# Patient Record
Sex: Female | Born: 1970 | Race: White | Hispanic: No | Marital: Married | Smoking: Never smoker
Health system: Southern US, Community
[De-identification: ages and names within clinical notes are randomized; demographics above are authoritative.]

## PROBLEM LIST (undated history)

## (undated) DIAGNOSIS — I10 Essential (primary) hypertension: Secondary | ICD-10-CM

## (undated) DIAGNOSIS — Z8489 Family history of other specified conditions: Secondary | ICD-10-CM

## (undated) DIAGNOSIS — Z9889 Other specified postprocedural states: Secondary | ICD-10-CM

## (undated) DIAGNOSIS — T4145XA Adverse effect of unspecified anesthetic, initial encounter: Secondary | ICD-10-CM

## (undated) DIAGNOSIS — D649 Anemia, unspecified: Secondary | ICD-10-CM

## (undated) DIAGNOSIS — T8859XA Other complications of anesthesia, initial encounter: Secondary | ICD-10-CM

## (undated) DIAGNOSIS — K219 Gastro-esophageal reflux disease without esophagitis: Secondary | ICD-10-CM

## (undated) DIAGNOSIS — F419 Anxiety disorder, unspecified: Secondary | ICD-10-CM

## (undated) DIAGNOSIS — R112 Nausea with vomiting, unspecified: Secondary | ICD-10-CM

---

## 2000-09-09 ENCOUNTER — Other Ambulatory Visit: Admission: RE | Admit: 2000-09-09 | Discharge: 2000-09-09 | Payer: Self-pay | Admitting: Family Medicine

## 2002-07-30 HISTORY — PX: TUBAL LIGATION: SHX77

## 2004-06-19 ENCOUNTER — Ambulatory Visit: Payer: Self-pay | Admitting: Family Medicine

## 2004-08-09 ENCOUNTER — Ambulatory Visit: Payer: Self-pay | Admitting: Family Medicine

## 2004-09-21 ENCOUNTER — Ambulatory Visit: Payer: Self-pay | Admitting: Family Medicine

## 2004-10-02 ENCOUNTER — Ambulatory Visit: Payer: Self-pay | Admitting: Family Medicine

## 2005-08-08 ENCOUNTER — Ambulatory Visit: Payer: Self-pay | Admitting: Family Medicine

## 2005-11-12 ENCOUNTER — Ambulatory Visit: Payer: Self-pay | Admitting: Family Medicine

## 2005-12-10 ENCOUNTER — Ambulatory Visit: Payer: Self-pay | Admitting: Family Medicine

## 2006-05-20 ENCOUNTER — Ambulatory Visit: Payer: Self-pay | Admitting: Family Medicine

## 2006-07-24 ENCOUNTER — Ambulatory Visit: Payer: Self-pay | Admitting: Family Medicine

## 2006-11-13 ENCOUNTER — Ambulatory Visit: Payer: Self-pay | Admitting: Family Medicine

## 2007-04-09 ENCOUNTER — Ambulatory Visit: Payer: Self-pay | Admitting: Family Medicine

## 2007-04-09 LAB — CONVERTED CEMR LAB
Ketones, urine, test strip: NEGATIVE
Nitrite: NEGATIVE
Specific Gravity, Urine: 1.015
Urobilinogen, UA: NEGATIVE
pH: 7

## 2007-04-14 ENCOUNTER — Telehealth: Payer: Self-pay | Admitting: Family Medicine

## 2007-04-14 DIAGNOSIS — E282 Polycystic ovarian syndrome: Secondary | ICD-10-CM | POA: Insufficient documentation

## 2007-04-15 ENCOUNTER — Ambulatory Visit: Payer: Self-pay | Admitting: Family Medicine

## 2007-04-15 DIAGNOSIS — I10 Essential (primary) hypertension: Secondary | ICD-10-CM

## 2007-05-08 ENCOUNTER — Telehealth: Payer: Self-pay | Admitting: Family Medicine

## 2007-05-28 ENCOUNTER — Ambulatory Visit: Payer: Self-pay | Admitting: Family Medicine

## 2007-06-09 ENCOUNTER — Ambulatory Visit: Payer: Self-pay | Admitting: Family Medicine

## 2007-08-20 ENCOUNTER — Ambulatory Visit: Payer: Self-pay | Admitting: Family Medicine

## 2007-12-30 ENCOUNTER — Ambulatory Visit: Payer: Self-pay | Admitting: Family Medicine

## 2008-01-01 ENCOUNTER — Telehealth: Payer: Self-pay | Admitting: Family Medicine

## 2008-09-02 ENCOUNTER — Ambulatory Visit: Payer: Self-pay | Admitting: Family Medicine

## 2008-09-02 DIAGNOSIS — B9789 Other viral agents as the cause of diseases classified elsewhere: Secondary | ICD-10-CM

## 2008-09-02 DIAGNOSIS — B349 Viral infection, unspecified: Secondary | ICD-10-CM | POA: Insufficient documentation

## 2008-09-23 ENCOUNTER — Telehealth: Payer: Self-pay | Admitting: Family Medicine

## 2008-10-19 ENCOUNTER — Ambulatory Visit: Payer: Self-pay | Admitting: Family Medicine

## 2008-10-20 ENCOUNTER — Telehealth (INDEPENDENT_AMBULATORY_CARE_PROVIDER_SITE_OTHER): Payer: Self-pay | Admitting: Internal Medicine

## 2010-03-07 ENCOUNTER — Encounter (INDEPENDENT_AMBULATORY_CARE_PROVIDER_SITE_OTHER): Payer: Self-pay | Admitting: *Deleted

## 2010-08-29 NOTE — Assessment & Plan Note (Signed)
Summary: elevated BP/KMO   Vital Signs:  Patient Profile:   40 Years Old Female Weight:      261 pounds Temp:     99.2 degrees F oral Pulse rate:   80 / minute Pulse rhythm:   regular BP sitting:   134 / 84  (left arm) Cuff size:   regular  Vitals Entered ByMarland Kitchen [place] (April 15, 2007 11:58 AM)                 Chief Complaint:  check bp.  History of Present Illness: Checking Bp since seen last time last week...not having any symptoms but BP has been elevated and she is worried about it. 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  Current Allergies: No known allergies    Family History:    Father A   Chol HTN    Mother A  HTN    No sibs    GF colon ca    GM leukemia     Physical Exam  General:     Well-developed,well-nourished,in no acute distress; alert,appropriate and cooperative throughout examination Head:     Normocephalic and atraumatic without obvious abnormalities. No apparent alopecia or balding. Eyes:     Conjunctiva clear bilaterally.  Ears:     External ear exam shows no significant lesions or deformities.  Otoscopic examination reveals clear canals, tympanic membranes are intact bilaterally without bulging, retraction, inflammation or discharge. Hearing is grossly normal bilaterally. Nose:     External nasal examination shows no deformity or inflammation. Nasal mucosa are pink and moist without lesions or exudates. Mouth:     Oral mucosa and oropharynx without lesions or exudates.  Teeth in good repair. Neck:     No deformities, masses, or tenderness noted. Chest Wall:     No deformities, masses, or tenderness noted. Lungs:     Normal respiratory effort, chest expands symmetrically. Lungs are clear to auscultation, no crackles or wheezes. Heart:     Normal rate and regular rhythm. S1 and S2 normal without gallop, murmur, click, rub or other extra sounds.    Impression & Recommendations:  Problem  # 1:  HYPERTENSION, BENIGN ESSENTIAL (ICD-401.1) Assessment: New  Her updated medication list for this problem includes:    Metoprolol Succinate 25 Mg Tb24 (Metoprolol succinate) .Marland Kitchen... 1/2 tab by mouth at night  BP today: 134/84 Prior BP: 149/101 (04/09/2007)   Complete Medication List: 1)  Metformin Hcl 850 Mg Tabs (Metformin hcl) .... Take 1 tablet by mouth two times a day 2)  Astelin 137 Mcg/spray Soln (Azelastine hcl) .... As needed 3)  Nasonex 50 Mcg/act Susp (Mometasone furoate) .... As needed 4)  Metoprolol Succinate 25 Mg Tb24 (Metoprolol succinate) .... 1/2 tab by mouth at night   Patient Instructions: 1)  RTC 6 wks for BP check.    Prescriptions: METOPROLOL SUCCINATE 25 MG  TB24 (METOPROLOL SUCCINATE) 1/2 tab by mouth at night  #15 x 12   Entered and Authorized by:   Shaune Leeks MD   Signed by:   Shaune Leeks MD on 04/15/2007   Method used:   Print then Give to Patient   RxID:   [id number]  ]

## 2010-08-29 NOTE — Letter (Signed)
Summary: Nadara Eaton letter  [place] at [facility]  [address] [place], [place] [address]   Phone: [phone]  Fax: [phone]       03/07/2010 MRN: [id number]  [facility] [address] [place], [place]  [address]  Dear Ms. [facility] [place], and [place] announce the retirement of Arta Silence, M.D., from full-time practice at the [facility] office effective January 26, 2010 and his plans of returning part-time.  It is important to Dr. Hetty Ely and to our practice that you understand that [facility] has seven physicians in our office for your health care needs.  We will continue to offer the same exceptional care that you have today.    Dr. Hetty Ely has spoken to many of you about his plans for retirement and returning part-time in the fall.   We will continue to work with you through the transition to schedule appointments for you in the office and meet the high standards that [place] is committed to.   Again, it is with great pleasure that we share the news that Dr. Hetty Ely will return to [facility] at [facility] in October of 2011 with a reduced schedule.    If you have any questions, or would like to request an appointment with one of our physicians, please call us at [phone] and press the option for Scheduling an appointment.  We take pleasure in providing you with excellent patient care and look forward to seeing you at your next office visit.  Our [facility] Physicians are:  Tillman Abide, M.D. Laurita Quint, M.D. Roxy Manns, M.D. Kerby Nora, M.D. Hannah Beat, M.D. Ruthe Mannan, M.D. We proudly welcomed Raechel Ache, M.D. and Eustaquio Boyden, M.D. to the practice in July/August 2011.  Sincerely,  [facility]

## 2010-12-15 NOTE — Assessment & Plan Note (Signed)
[facility]                                 ON-CALL NOTE   Jessica Hampton, Jessica Hampton                        MRN:          [id number]  DATE:07/27/2007                            DOB:          16-Sep-1970    PRIMARY CARE PHYSICIAN:  Dr. Hetty Ely.   Ms. Stankiewicz is a patient of Dr. Hetty Ely, who reports that she was started  on metoprolol XR 25 mg, a quarter of a tablet daily after noted to have  high blood pressure associated with headache.  She reports that the  headaches had improved while she was on the metoprolol, but over the  last week she has noticed that the headache has come back, but not as  intense.  She is wondering if her headaches are truly due to  hypertension since her blood pressure is now controlled, and she is  having the headaches again.   PLAN:  I advised the patient that since her headaches have started  again, although not as intense, she should follow up with Dr. Hetty Ely  to discuss further evaluation.  Patient agreed.  She will continue  taking the metoprolol as described by Dr. Hetty Ely, and will follow up  on Monday by phone for further recommendations.  Patient is aware she  can call with any concerns over the weekend.     Leanne Chang, M.D.  Electronically Signed    LA/MedQ  DD: 07/27/2007  DT: 07/27/2007  Job #: [id number]

## 2010-12-15 NOTE — Assessment & Plan Note (Signed)
[facility]                                 ON-CALL NOTE   KORTNE, ALL                        MRN:          [id number]  DATE:11/16/2006                            DOB:          08-Aug-1970    PRIMARY CARE PHYSICIAN:  Arta Silence, MD.   PHONE NUMBER:  Phone number is [phone].   SUBJECTIVE:  Currently being treated for sinus infection, has been on  amoxicillin 500 mg 2 tablets p.o. t.i.d. for 3 days.  She is also using  guaifenesin.  She feels her symptoms are not improving.   ASSESSMENT AND PLAN:  Recommended more time on the antibiotics.  Also  recommended nasal saline 3-4 times daily, as well as an allergy  medication.     Kerby Nora, MD  Electronically Signed    AB/MedQ  DD: 11/16/2006  DT: 11/16/2006  Job #: [id number]

## 2011-05-25 ENCOUNTER — Ambulatory Visit: Payer: Self-pay | Admitting: Family Medicine

## 2011-05-28 ENCOUNTER — Encounter: Payer: Self-pay | Admitting: Family Medicine

## 2011-05-28 ENCOUNTER — Ambulatory Visit (INDEPENDENT_AMBULATORY_CARE_PROVIDER_SITE_OTHER): Payer: Managed Care, Other (non HMO) | Admitting: Family Medicine

## 2011-05-28 DIAGNOSIS — J069 Acute upper respiratory infection, unspecified: Secondary | ICD-10-CM

## 2011-05-28 MED ORDER — CHLORPHENIRAMINE-HYDROCODONE 8-10 MG/5ML PO LQCR: 5.0000 mL | Freq: Two times a day (BID) | ORAL | Status: AC | PRN

## 2011-05-28 NOTE — Patient Instructions (Signed)
Drink lots of fluids.  Treat sympotmatically with Mucinex, nasal saline irrigation, and Tylenol/Ibuprofen. Also try claritin D or zyrtec D over the counter- two times a day as needed ( have to sign for them at pharmacy). You can use warm compresses.  Cough suppressant at night. Call if not improving as expected in 5-7 days.

## 2011-05-28 NOTE — Progress Notes (Signed)
SUBJECTIVE:  Jessica Hampton is a 40 y.o. female who complains of coryza, congestion, sneezing, myalgias and headache for 3 days. She denies a history of chest pain, dizziness and fatigue and denies a history of asthma. Patient denies smoke cigarettes.   Patient Active Problem List  Diagnoses  . VIRAL INFECTION, ACUTE  . POLYCYSTIC OVARIAN DISEASE  . HYPERTENSION, BENIGN ESSENTIAL   No past medical history on file. No past surgical history on file. History  Substance Use Topics  . Smoking status: Never Smoker   . Smokeless tobacco: Never Used  . Alcohol Use: No   Family History  Problem Relation Age of Onset  . Hypertension Mother   . Hyperlipidemia Father   . Hypertension Father    No Known Allergies  Current outpatient prescriptions:chlorpheniramine-hydrocodone (TUSSIONEX) 8-10 MG/5ML suspension, Take 5 mLs by mouth every 12 (twelve) hours as needed for cough., Disp: 60 mL, Rfl: 0;  metFORMIN (GLUCOPHAGE) 850 MG tablet, Take 850 mg by mouth 2 (two) times daily with a meal.  , Disp: , Rfl: ;  metoprolol succinate (TOPROL-XL) 25 MG 24 hr tablet, Take 25 mg by mouth daily.  , Disp: , Rfl:   The PMH, PSH, Social History, Family History, Medications, and allergies have been reviewed in [facility], and have been updated if relevant.  OBJECTIVE: BP 160/110  Pulse 84  Temp(Src) 98.5 F (36.9 C) (Oral)  Wt 246 lb (111.585 kg)  She appears well, vital signs are as noted. Ears normal.  Throat and pharynx normal.  Neck supple. No adenopathy in the neck. Nose is congested. Sinuses non tender. The chest is clear, without wheezes or rales.  ASSESSMENT:  viral upper respiratory illness  PLAN: Symptomatic therapy suggested: push fluids, rest and return office visit prn if symptoms persist or worsen. Lack of antibiotic effectiveness discussed with her. Call or return to clinic prn if these symptoms worsen or fail to improve as anticipated.

## 2016-09-25 ENCOUNTER — Encounter: Payer: Self-pay | Admitting: *Deleted

## 2016-09-25 NOTE — Patient Instructions (Signed)
  Your procedure is scheduled on: 10-02-16 (TUESDAY) Report to Same Day Surgery 2nd floor medical mall [facility] Entrance-take elevator on left to 2nd floor.  Check in with surgery information desk.) To find out your arrival time please call [phone] between 1PM - 3PM on 10-01-16 [facility])  Remember: Instructions that are not followed completely may result in serious medical risk, up to and including death, or upon the discretion of your surgeon and anesthesiologist your surgery may need to be rescheduled.    _x___ 1. Do not eat food or drink liquids after midnight. No gum chewing or hard candies.     __x__ 2. No Alcohol for 24 hours before or after surgery.   __x__3. No Smoking for 24 prior to surgery.   ____  4. Bring all medications with you on the day of surgery if instructed.    __x__ 5. Notify your doctor if there is any change in your medical condition     (cold, fever, infections).     Do not wear jewelry, make-up, hairpins, clips or nail polish.  Do not wear lotions, powders, or perfumes. You may wear deodorant.  Do not shave 48 hours prior to surgery. Men may shave face and neck.  Do not bring valuables to the hospital.    [facility] is not responsible for any belongings or valuables.               Contacts, dentures or bridgework may not be worn into surgery.  Leave your suitcase in the car. After surgery it may be brought to your room.  For patients admitted to the hospital, discharge time is determined by your treatment team.   Patients discharged the day of surgery will not be allowed to drive home.  You will need someone to drive you home and stay with you the night of your procedure.    Please read over the following fact sheets that you were given:   [facility] Preparing for Surgery and or MRSA Information   ____ Take these medicines the morning of surgery with A SIP OF WATER:    1. NONE  2.  3.  4.  5.  6.  ____Fleets enema or Magnesium Citrate as  directed.   _x___ Use CHG Soap or sage wipes as directed on instruction sheet   ____ Use inhalers on the day of surgery and bring to hospital day of surgery  ____ Stop metformin 2 days prior to surgery    ____ Take 1/2 of usual insulin dose the night before surgery and none on the morning of  surgery.   ____ Stop Aspirin, Coumadin, Pllavix ,Eliquis, Effient, or Pradaxa  x__ Stop Anti-inflammatories such as Advil, Aleve, Ibuprofen, Motrin, Naproxen,          Naprosyn, Goodies powders or aspirin products NOW-Ok to take Tylenol.   ____ Stop supplements until after surgery.    ____ Bring C-Pap to the hospital.

## 2016-09-27 ENCOUNTER — Encounter
Admission: RE | Admit: 2016-09-27 | Discharge: 2016-09-27 | Disposition: A | Discharge status: Home / Self Care | Payer: Managed Care, Other (non HMO) | Source: Ambulatory Visit | Attending: Specialist | Admitting: Specialist

## 2016-09-27 DIAGNOSIS — Z01812 Encounter for preprocedural laboratory examination: Secondary | ICD-10-CM | POA: Diagnosis not present

## 2016-09-27 HISTORY — DX: Other specified postprocedural states: Z98.890

## 2016-09-27 HISTORY — DX: Essential (primary) hypertension: I10

## 2016-09-27 HISTORY — DX: Adverse effect of unspecified anesthetic, initial encounter: T41.45XA

## 2016-09-27 HISTORY — DX: Family history of other specified conditions: Z84.89

## 2016-09-27 HISTORY — DX: Nausea with vomiting, unspecified: R11.2

## 2016-09-27 HISTORY — DX: Gastro-esophageal reflux disease without esophagitis: K21.9

## 2016-09-27 HISTORY — DX: Anemia, unspecified: D64.9

## 2016-09-27 HISTORY — DX: Other complications of anesthesia, initial encounter: T88.59XA

## 2016-09-27 LAB — TYPE AND SCREEN
ABO/RH(D): A POS
ANTIBODY SCREEN: NEGATIVE

## 2016-10-01 MED ORDER — DEXTROSE 5 % IV SOLN
3.0000 g | Freq: Once | INTRAVENOUS | Status: AC
  Administered 2016-10-02: 3 g via INTRAVENOUS
  Filled 2016-10-01: qty 3

## 2016-10-01 NOTE — Pre-Procedure Instructions (Signed)
MEDICAL CLEARANCE ON CHART FROM PCP AND CARDIAC CLEARANCE ON CHART (LOW-INTERMEDIATE RISK PER CARDIOLOGY)

## 2016-10-02 ENCOUNTER — Inpatient Hospital Stay: Payer: Managed Care, Other (non HMO) | Admitting: Anesthesiology

## 2016-10-02 ENCOUNTER — Inpatient Hospital Stay
Admission: RE | Admit: 2016-10-02 | Discharge: 2016-10-03 | DRG: 621 | Disposition: A | Discharge status: Home / Self Care | Payer: Managed Care, Other (non HMO) | Source: Ambulatory Visit | Attending: Specialist | Admitting: Specialist

## 2016-10-02 ENCOUNTER — Encounter
Admission: RE | Disposition: A | Discharge status: Home / Self Care | Payer: Self-pay | Source: Ambulatory Visit | Attending: Specialist

## 2016-10-02 ENCOUNTER — Encounter: Payer: Self-pay | Admitting: *Deleted

## 2016-10-02 DIAGNOSIS — Z6841 Body Mass Index (BMI) 40.0 and over, adult: Secondary | ICD-10-CM | POA: Diagnosis not present

## 2016-10-02 DIAGNOSIS — K449 Diaphragmatic hernia without obstruction or gangrene: Secondary | ICD-10-CM | POA: Diagnosis present

## 2016-10-02 DIAGNOSIS — Z9889 Other specified postprocedural states: Secondary | ICD-10-CM | POA: Diagnosis present

## 2016-10-02 HISTORY — PX: LAPAROSCOPIC GASTRIC SLEEVE RESECTION WITH HIATAL HERNIA REPAIR: SHX6512

## 2016-10-02 LAB — POCT PREGNANCY, URINE: PREG TEST UR: NEGATIVE

## 2016-10-02 LAB — ABO/RH: ABO/RH(D): A POS

## 2016-10-02 SURGERY — GASTRECTOMY, SLEEVE, LAPAROSCOPIC, WITH HIATAL HERNIA REPAIR
Anesthesia: Monitor Anesthesia Care | Wound class: Clean Contaminated

## 2016-10-02 MED ORDER — ONDANSETRON HCL 4 MG/2ML IJ SOLN
4.0000 mg | INTRAMUSCULAR | Status: DC | PRN
Start: 2016-10-02 — End: 2016-10-03
  Administered 2016-10-02: 4 mg via INTRAVENOUS
  Filled 2016-10-02: qty 2

## 2016-10-02 MED ORDER — DEXAMETHASONE SODIUM PHOSPHATE 10 MG/ML IJ SOLN
INTRAMUSCULAR | Status: AC
  Filled 2016-10-02: qty 1

## 2016-10-02 MED ORDER — LIDOCAINE-EPINEPHRINE 1 %-1:100000 IJ SOLN
INTRAMUSCULAR | Status: AC
  Filled 2016-10-02: qty 1

## 2016-10-02 MED ORDER — LIDOCAINE HCL (PF) 2 % IJ SOLN
INTRAMUSCULAR | Status: AC
  Filled 2016-10-02: qty 2

## 2016-10-02 MED ORDER — BUPIVACAINE-EPINEPHRINE (PF) 0.5% -1:200000 IJ SOLN
INTRAMUSCULAR | Status: DC | PRN
  Administered 2016-10-02: 20 mL

## 2016-10-02 MED ORDER — SEVOFLURANE IN SOLN
RESPIRATORY_TRACT | Status: AC
  Filled 2016-10-02: qty 250

## 2016-10-02 MED ORDER — PROMETHAZINE HCL 25 MG/ML IJ SOLN
INTRAMUSCULAR | Status: AC
  Filled 2016-10-02: qty 1

## 2016-10-02 MED ORDER — BUPIVACAINE-EPINEPHRINE (PF) 0.5% -1:200000 IJ SOLN
INTRAMUSCULAR | Status: AC
  Filled 2016-10-02: qty 30

## 2016-10-02 MED ORDER — SUGAMMADEX SODIUM 200 MG/2ML IV SOLN
INTRAVENOUS | Status: DC | PRN
  Administered 2016-10-02: 250 mg via INTRAVENOUS

## 2016-10-02 MED ORDER — PANTOPRAZOLE SODIUM 40 MG IV SOLR
40.0000 mg | Freq: Every day | INTRAVENOUS | Status: DC
  Administered 2016-10-02: 40 mg via INTRAVENOUS
  Filled 2016-10-02: qty 40

## 2016-10-02 MED ORDER — KETOROLAC TROMETHAMINE 30 MG/ML IJ SOLN
INTRAMUSCULAR | Status: AC
  Filled 2016-10-02: qty 1

## 2016-10-02 MED ORDER — PROPOFOL 10 MG/ML IV BOLUS: INTRAVENOUS | Status: DC | PRN

## 2016-10-02 MED ORDER — ONDANSETRON HCL 4 MG/2ML IJ SOLN
INTRAMUSCULAR | Status: DC | PRN
  Administered 2016-10-02: 4 mg via INTRAVENOUS

## 2016-10-02 MED ORDER — LIDOCAINE-EPINEPHRINE 1 %-1:100000 IJ SOLN
INTRAMUSCULAR | Status: DC | PRN
  Administered 2016-10-02: 14 mL

## 2016-10-02 MED ORDER — HYDROCODONE-ACETAMINOPHEN 7.5-325 MG/15ML PO SOLN: 15.0000 mL | Freq: Four times a day (QID) | ORAL | 0 refills | Status: AC | PRN

## 2016-10-02 MED ORDER — METOPROLOL SUCCINATE ER 50 MG PO TB24: 50.0000 mg | ORAL_TABLET | Freq: Every day | ORAL | Status: DC

## 2016-10-02 MED ORDER — SCOPOLAMINE 1 MG/3DAYS TD PT72
MEDICATED_PATCH | TRANSDERMAL | Status: AC
  Administered 2016-10-02: 1.5 mg via TRANSDERMAL
  Filled 2016-10-02: qty 1

## 2016-10-02 MED ORDER — ONDANSETRON 4 MG PO TBDP: 4.0000 mg | ORAL_TABLET | Freq: Four times a day (QID) | ORAL | 0 refills | Status: DC | PRN

## 2016-10-02 MED ORDER — PROMETHAZINE HCL 25 MG/ML IJ SOLN: 12.5000 mg | INTRAMUSCULAR | Status: DC

## 2016-10-02 MED ORDER — FENTANYL CITRATE (PF) 100 MCG/2ML IJ SOLN
INTRAMUSCULAR | Status: AC
  Administered 2016-10-02: 25 ug via INTRAVENOUS
  Filled 2016-10-02: qty 2

## 2016-10-02 MED ORDER — MORPHINE SULFATE (PF) 2 MG/ML IV SOLN: 1.0000 mg | INTRAVENOUS | Status: DC | PRN

## 2016-10-02 MED ORDER — ONDANSETRON HCL 4 MG/2ML IJ SOLN
4.0000 mg | Freq: Once | INTRAMUSCULAR | Status: DC | PRN
  Administered 2016-10-02: 4 mg via INTRAVENOUS

## 2016-10-02 MED ORDER — LACTATED RINGERS IV SOLN
INTRAVENOUS | Status: DC
  Administered 2016-10-02: 50 mL/h via INTRAVENOUS
  Administered 2016-10-02: 13:00:00 via INTRAVENOUS

## 2016-10-02 MED ORDER — LIDOCAINE HCL (CARDIAC) 20 MG/ML IV SOLN
INTRAVENOUS | Status: DC | PRN
  Administered 2016-10-02: 40 mg via INTRAVENOUS

## 2016-10-02 MED ORDER — ONDANSETRON HCL 4 MG/2ML IJ SOLN
INTRAMUSCULAR | Status: AC
  Filled 2016-10-02: qty 2

## 2016-10-02 MED ORDER — ENOXAPARIN SODIUM 40 MG/0.4ML ~~LOC~~ SOLN
40.0000 mg | SUBCUTANEOUS | Status: DC
  Administered 2016-10-03: 40 mg via SUBCUTANEOUS
  Filled 2016-10-02: qty 0.4

## 2016-10-02 MED ORDER — FENTANYL CITRATE (PF) 100 MCG/2ML IJ SOLN
INTRAMUSCULAR | Status: AC
  Filled 2016-10-02: qty 2

## 2016-10-02 MED ORDER — ROCURONIUM BROMIDE 50 MG/5ML IV SOLN
INTRAVENOUS | Status: AC
  Filled 2016-10-02: qty 1

## 2016-10-02 MED ORDER — FENTANYL CITRATE (PF) 100 MCG/2ML IJ SOLN
25.0000 ug | INTRAMUSCULAR | Status: DC | PRN
  Administered 2016-10-02 (×4): 25 ug via INTRAVENOUS

## 2016-10-02 MED ORDER — ACETAMINOPHEN 325 MG PO TABS: 650.0000 mg | ORAL_TABLET | ORAL | Status: DC | PRN

## 2016-10-02 MED ORDER — SODIUM CHLORIDE FLUSH 0.9 % IV SOLN
INTRAVENOUS | Status: AC
  Filled 2016-10-02: qty 10

## 2016-10-02 MED ORDER — ACETAMINOPHEN 10 MG/ML IV SOLN
INTRAVENOUS | Status: AC
  Filled 2016-10-02: qty 100

## 2016-10-02 MED ORDER — ACETAMINOPHEN 160 MG/5ML PO SOLN
325.0000 mg | ORAL | Status: DC | PRN
  Administered 2016-10-02: 325 mg via ORAL
  Filled 2016-10-02 (×2): qty 20.3

## 2016-10-02 MED ORDER — DEXTROSE 5 % IV SOLN
2.0000 g | Freq: Three times a day (TID) | INTRAVENOUS | Status: AC
  Administered 2016-10-02 – 2016-10-03 (×2): 2 g via INTRAVENOUS
  Filled 2016-10-02 (×2): qty 2

## 2016-10-02 MED ORDER — SODIUM CHLORIDE 0.9 % IJ SOLN
INTRAMUSCULAR | Status: AC
  Filled 2016-10-02: qty 10

## 2016-10-02 MED ORDER — MIDAZOLAM HCL 2 MG/2ML IJ SOLN
INTRAMUSCULAR | Status: DC | PRN
  Administered 2016-10-02: 2 mg via INTRAVENOUS

## 2016-10-02 MED ORDER — SCOPOLAMINE 1 MG/3DAYS TD PT72
1.0000 | MEDICATED_PATCH | Freq: Once | TRANSDERMAL | Status: AC
  Administered 2016-10-02: 1.5 mg via TRANSDERMAL

## 2016-10-02 MED ORDER — ACETAMINOPHEN 10 MG/ML IV SOLN
1000.0000 mg | Freq: Once | INTRAVENOUS | Status: AC
  Administered 2016-10-02: 1000 mg via INTRAVENOUS

## 2016-10-02 MED ORDER — SODIUM CHLORIDE 0.9 % IV SOLN
INTRAVENOUS | Status: DC
  Administered 2016-10-02: 150 mL via INTRAVENOUS
  Administered 2016-10-02 – 2016-10-03 (×2): via INTRAVENOUS

## 2016-10-02 MED ORDER — FENTANYL CITRATE (PF) 100 MCG/2ML IJ SOLN
INTRAMUSCULAR | Status: DC | PRN
  Administered 2016-10-02: 100 ug via INTRAVENOUS
  Administered 2016-10-02: 50 ug via INTRAVENOUS

## 2016-10-02 MED ORDER — ENOXAPARIN SODIUM 40 MG/0.4ML ~~LOC~~ SOLN: 40.0000 mg | SUBCUTANEOUS | 0 refills | Status: DC

## 2016-10-02 MED ORDER — PROPOFOL 10 MG/ML IV BOLUS
INTRAVENOUS | Status: AC
  Filled 2016-10-02: qty 20

## 2016-10-02 MED ORDER — LIDOCAINE HCL 2 % EX GEL
CUTANEOUS | Status: AC
  Filled 2016-10-02: qty 5

## 2016-10-02 MED ORDER — PROPOFOL 10 MG/ML IV BOLUS
INTRAVENOUS | Status: DC | PRN
  Administered 2016-10-02: 180 mg via INTRAVENOUS

## 2016-10-02 MED ORDER — SUGAMMADEX SODIUM 200 MG/2ML IV SOLN
INTRAVENOUS | Status: AC
  Filled 2016-10-02: qty 2

## 2016-10-02 MED ORDER — MIDAZOLAM HCL 2 MG/2ML IJ SOLN
INTRAMUSCULAR | Status: AC
  Filled 2016-10-02: qty 2

## 2016-10-02 MED ORDER — OMEPRAZOLE 40 MG PO CPDR: 40.0000 mg | DELAYED_RELEASE_CAPSULE | Freq: Every day | ORAL | 5 refills | Status: DC

## 2016-10-02 MED ORDER — ROCURONIUM BROMIDE 100 MG/10ML IV SOLN
INTRAVENOUS | Status: DC | PRN
  Administered 2016-10-02: 10 mg via INTRAVENOUS
  Administered 2016-10-02: 40 mg via INTRAVENOUS

## 2016-10-02 MED ORDER — DEXAMETHASONE SODIUM PHOSPHATE 10 MG/ML IJ SOLN
INTRAMUSCULAR | Status: DC | PRN
  Administered 2016-10-02: 10 mg via INTRAVENOUS

## 2016-10-02 MED ORDER — OXYCODONE HCL 5 MG/5ML PO SOLN
5.0000 mg | ORAL | Status: DC | PRN
  Administered 2016-10-02: 5 mg via ORAL
  Administered 2016-10-03 (×3): 10 mg via ORAL
  Filled 2016-10-02: qty 5
  Filled 2016-10-02 (×3): qty 10

## 2016-10-02 SURGICAL SUPPLY — 49 items
APPLIER CLIP ROT 13.4 12 LRG (CLIP) ×3
BANDAGE ELASTIC 6 LF NS (GAUZE/BANDAGES/DRESSINGS) ×6 IMPLANT
BLADE SURG SZ11 CARB STEEL (BLADE) ×3 IMPLANT
CANISTER SUCT 1200ML W/VALVE (MISCELLANEOUS) ×3 IMPLANT
CHLORAPREP W/TINT 26ML (MISCELLANEOUS) ×6 IMPLANT
CLIP APPLIE ROT 13.4 12 LRG (CLIP) ×1 IMPLANT
DECANTER SPIKE VIAL GLASS SM (MISCELLANEOUS) ×6 IMPLANT
DEFOGGER SCOPE WARMER CLEARIFY (MISCELLANEOUS) ×3 IMPLANT
DERMABOND ADVANCED (GAUZE/BANDAGES/DRESSINGS) ×2
DERMABOND ADVANCED .7 DNX12 (GAUZE/BANDAGES/DRESSINGS) ×1 IMPLANT
DRAPE UTILITY 15X26 TOWEL STRL (DRAPES) ×6 IMPLANT
FILTER LAP SMOKE EVAC STRL (MISCELLANEOUS) ×3 IMPLANT
GLOVE BIO SURGEON STRL SZ8 (GLOVE) ×3 IMPLANT
GOWN STRL REUS W/ TWL LRG LVL3 (GOWN DISPOSABLE) ×3 IMPLANT
GOWN STRL REUS W/ TWL XL LVL3 (GOWN DISPOSABLE) ×1 IMPLANT
GOWN STRL REUS W/TWL LRG LVL3 (GOWN DISPOSABLE) ×6
GOWN STRL REUS W/TWL XL LVL3 (GOWN DISPOSABLE) ×2
IRRIGATION STRYKERFLOW (MISCELLANEOUS) ×1 IMPLANT
IRRIGATOR STRYKERFLOW (MISCELLANEOUS) ×3
IV NS 1000ML (IV SOLUTION) ×2
IV NS 1000ML BAXH (IV SOLUTION) ×1 IMPLANT
KIT RM TURNOVER STRD PROC AR (KITS) ×3 IMPLANT
LABEL OR SOLS (LABEL) ×3 IMPLANT
LIQUID BAND (GAUZE/BANDAGES/DRESSINGS) ×3 IMPLANT
NDL INSUFF 14G 150MM VS150000 (NEEDLE) ×3 IMPLANT
NDL SAFETY 22GX1.5 (NEEDLE) ×3 IMPLANT
NS IRRIG 500ML POUR BTL (IV SOLUTION) ×3 IMPLANT
PACK LAP CHOLECYSTECTOMY (MISCELLANEOUS) ×3 IMPLANT
RELOAD GREEN (STAPLE) ×3 IMPLANT
RELOAD STAPLER GOLD 60MM (STAPLE) ×4 IMPLANT
SHEARS HARMONIC ACE PLUS 45CM (MISCELLANEOUS) ×3 IMPLANT
SLEEVE ENDOPATH XCEL 5M (ENDOMECHANICALS) ×3 IMPLANT
SLEEVE GASTRECTOMY 36FR VISIGI (MISCELLANEOUS) ×3 IMPLANT
SOL ANTI-FOG 6CC FOG-OUT (MISCELLANEOUS) ×1 IMPLANT
SOL FOG-OUT ANTI-FOG 6CC (MISCELLANEOUS) ×2
STAPLER ECHELON BIOABSB 60 FLE (MISCELLANEOUS) ×15 IMPLANT
STAPLER ECHELON LONG 60 440 (INSTRUMENTS) ×3 IMPLANT
STAPLER RELOAD GOLD 60MM (STAPLE) ×12
SUT DEVICE BRAIDED 0X39 (SUTURE) ×3 IMPLANT
SUT VIC AB 3-0 SH 27 (SUTURE)
SUT VIC AB 3-0 SH 27X BRD (SUTURE) IMPLANT
SUT VIC AB 4-0 PS2 18 (SUTURE) ×6 IMPLANT
TROCAR BLADELESS 15MM (ENDOMECHANICALS) ×3 IMPLANT
TROCAR SL VERSASTEP 5M LG  B (MISCELLANEOUS) ×2
TROCAR SL VERSASTEP 5M LG B (MISCELLANEOUS) ×1 IMPLANT
TROCAR XCEL 12X100 BLDLESS (ENDOMECHANICALS) ×3 IMPLANT
TROCAR XCEL NON-BLD 5MMX100MML (ENDOMECHANICALS) ×3 IMPLANT
TUBING INSUFFLATOR HEATED (MISCELLANEOUS) ×3 IMPLANT
WATER STERILE IRR 1000ML POUR (IV SOLUTION) ×3 IMPLANT

## 2016-10-02 NOTE — Anesthesia Procedure Notes (Signed)
Procedure Name: Intubation Date/Time: 10/02/2016 1:10 PM Performed by: Stephanieann Popescu Pre-anesthesia Checklist: Patient identified, Emergency Drugs available, Suction available, Patient being monitored and Timeout performed Patient Re-evaluated:Patient Re-evaluated prior to inductionOxygen Delivery Method: Circle system utilized Preoxygenation: Pre-oxygenation with 100% oxygen Intubation Type: IV induction Ventilation: Mask ventilation without difficulty Laryngoscope Size: Mac and 3 Grade View: Grade I Tube type: Oral Tube size: 7.0 mm Number of attempts: 1 Airway Equipment and Method: Stylet Placement Confirmation: ETT inserted through vocal cords under direct vision,  positive ETCO2 and breath sounds checked- equal and bilateral Secured at: 21 cm Tube secured with: Tape Dental Injury: Teeth and Oropharynx as per pre-operative assessment

## 2016-10-02 NOTE — Anesthesia Post-op Follow-up Note (Cosign Needed)
Anesthesia QCDR form completed.        

## 2016-10-02 NOTE — H&P (Signed)
See scanned h and p No new3 changes Heart and lungs normal

## 2016-10-02 NOTE — Anesthesia Preprocedure Evaluation (Addendum)
Anesthesia Evaluation  Patient identified by MRN, date of birth, ID band Patient awake    Reviewed: Allergy & Precautions, NPO status , Patient's Chart, lab work & pertinent test results, reviewed documented beta blocker date and time   History of Anesthesia Complications (+) PONV, Family history of anesthesia reaction and history of anesthetic complications  Airway Mallampati: II  TM Distance: <3 FB Neck ROM: Full    Dental no notable dental hx.    Pulmonary neg pulmonary ROS, neg sleep apnea, neg COPD,    Pulmonary exam normal breath sounds clear to auscultation- rhonchi (-) wheezing      Cardiovascular Exercise Tolerance: Good hypertension, Pt. on medications and Pt. on home beta blockers (-) CAD and (-) Past MI Normal cardiovascular exam Rhythm:Regular Rate:Normal - Systolic murmurs and - Diastolic murmurs    Neuro/Psych negative neurological ROS  negative psych ROS   GI/Hepatic Neg liver ROS, GERD  Medicated,  Endo/Other  negative endocrine ROSneg diabetes  Renal/GU negative Renal ROS     Musculoskeletal negative musculoskeletal ROS (+)   Abdominal Normal abdominal exam  (+) + obese,   Peds  Hematology  (+) anemia ,   Anesthesia Other Findings Past Medical History: No date: Anemia No date: Complication of anesthesia No date: Family history of adverse reaction to anesthes*     Comment: MOM NAUSEATED No date: GERD (gastroesophageal reflux disease)     Comment: OCC No date: Hypertension No date: PONV (postoperative nausea and vomiting)     Comment: NAUSEATED   Reproductive/Obstetrics                            Anesthesia Physical Anesthesia Plan  ASA: II  Anesthesia Plan: General   Post-op Pain Management:    Induction: Intravenous  Airway Management Planned:   Additional Equipment:   Intra-op Plan:   Post-operative Plan: Extubation in OR  Informed Consent: I have  reviewed the patients History and Physical, chart, labs and discussed the procedure including the risks, benefits and alternatives for the proposed anesthesia with the patient or authorized representative who has indicated his/her understanding and acceptance.   Dental advisory given  Plan Discussed with: CRNA, Surgeon and Anesthesiologist  Anesthesia Plan Comments:        Anesthesia Quick Evaluation

## 2016-10-02 NOTE — Transfer of Care (Signed)
Immediate Anesthesia Transfer of Care Note  Patient: Jessica Hampton  Procedure(s) Performed: Procedure(s): LAPAROSCOPIC GASTRIC SLEEVE RESECTION WITH HIATAL HERNIA REPAIR (N/A)  Patient Location: PACU  Anesthesia Type:General  Level of Consciousness: awake  Airway & Oxygen Therapy: Patient Spontanous Breathing and Patient connected to face mask oxygen  Post-op Assessment: Report given to RN and Post -op Vital signs reviewed and stable  Post vital signs: Reviewed and stable  Last Vitals:  Vitals:   10/02/16 1049 10/02/16 1425  BP: (!) 137/91   Pulse: 93   Resp: 20   Temp: 36.6 C (P) 36.3 C    Last Pain:  Vitals:   10/02/16 1049  TempSrc: Tympanic      Patients Stated Pain Goal: 2 ([id number])  Complications: No apparent anesthesia complications

## 2016-10-02 NOTE — Op Note (Signed)
Preoperative diagnosis: Obesity and hiatal hernia Postoperative diagnosis: Same Procedure: Sleeve gastrectomy with hiatal hernia repair Surgeon: Darnell Level Assistant: Lorenda Cahill Complications: None Specimens: Portion of stomach Anesthesia: Gen. endotracheal Drains: None Bougie signs: 36 French  Clinical history: The patient was taken to the operating room placed the operating table in supine position. Monitors were placed. Oxygen was delivered. Anabolic surgeon. Patient was placed under general anesthesia without incident. Sequential signs were placed. Timeout was performed. The abdomen was accessed with a 5 mm optical trocar technique. Pneumoperitoneum established without difficulty. Multiple other trochars placed in preparation for the procedure. A liver retractor was placed. The greater curve was mobilized using a Harmonic Scalpel without issue. This is done from the antrum all way to the angle of Hiss. Posterior attachments were taken down as well. All bloodlessly. Once this was freed up if 36 French bougie was inserted transorally and guided to the antrum. The antrum was bisected with a echelon green load 60 mm stapler fire reinforces seam guard. Multiple diagnosis single roughly parallel to the lesser curvature. Excess stomach was brought up through the 15 mm port. The angle of Hiss was preserved with a little dog ear to prevent leakage at this area. The staple line was without any evidence of portion and had no evidence leak or bleeding. Attention was then placed the hiatus very previously diagnosed hiatal hernia was present. This was dissected free posteriorly using a Harmonic Scalpel mobilizing good segment of 3-4 cm of intra-abdominal esophagus. The hiatus was then closed using interrupted Surgidac suture 1. This is a small to moderate hiatal hernia. The trochars removed without incident the wounds closed using running 4-0 Vicryl and Dermabond. The fascia the 15 mm port was closed with a [place]  0 Vicryl suture.

## 2016-10-03 ENCOUNTER — Encounter: Payer: Self-pay | Admitting: Specialist

## 2016-10-03 LAB — CBC WITH DIFFERENTIAL/PLATELET
BASOS PCT: 1 %
Basophils Absolute: 0.1 10*3/uL (ref 0–0.1)
EOS ABS: 0 10*3/uL (ref 0–0.7)
EOS PCT: 0 %
HCT: 34.3 % — ABNORMAL LOW (ref 35.0–47.0)
Hemoglobin: 11.3 g/dL — ABNORMAL LOW (ref 12.0–16.0)
LYMPHS ABS: 1.1 10*3/uL (ref 1.0–3.6)
Lymphocytes Relative: 10 %
MCH: 25.7 pg — AB (ref 26.0–34.0)
MCHC: 32.8 g/dL (ref 32.0–36.0)
MCV: 78.2 fL — ABNORMAL LOW (ref 80.0–100.0)
Monocytes Absolute: 0.5 10*3/uL (ref 0.2–0.9)
Monocytes Relative: 4 %
Neutro Abs: 9.7 10*3/uL — ABNORMAL HIGH (ref 1.4–6.5)
Neutrophils Relative %: 85 %
PLATELETS: 279 10*3/uL (ref 150–440)
RBC: 4.38 MIL/uL (ref 3.80–5.20)
RDW: 16.6 % — ABNORMAL HIGH (ref 11.5–14.5)
WBC: 11.4 10*3/uL — AB (ref 3.6–11.0)

## 2016-10-03 NOTE — Progress Notes (Signed)
INTERVENTION:  RD consulted for nutrition education regarding inpatient bariatric surgery.   RD provided "The Liquid Diet" handout from the Bariatric Surgery Guide from the [facility]. This handout previously provided to patient prior to surgery is a duplicate copy. Discussed what foods/liquids are consistent with a Clear Liquid Diet and reinforced Key Concepts such as no carbonation, no caffeine, or sugar containing beverages. Provided methods to prevent dehydration and promote protein intake, using clock and sample fluid schedule. RD encouraged follow-up with outpatient dietitian after discharge.  Teach back method used.  Expect good compliance.  NUTRITION DIAGNOSIS:  Food and nutrition knowledge related deficit related to recent bariatric surgery as evidenced by dietitian consult for nutrition education   GOAL:  Patient will be able to sip and tolerate CL within 24-48 hours  MONITOR:  Energy intake Digestive system  ASSESSMENT:  45 year old female s/p laparoscopic sleeve gastrectomy with hiatal hernia repair on 10/02/2016.   Spoke with patient at bedside. Her husband, mother, and father were also present in room. Patient reports she is tolerating clear liquids well. She is drinking her clear protein supplement brought in from home (Isopure) and also sipping on other clear liquids. She reports some nausea this morning that has now resolved (patient reports it was likely gas). Patient reports she has Premier Protein at home for her full-liquid protein drink and enjoys it. She also has her B-complex vitamin at home. Reports she has her appropriate follow-up appointments scheduled already.  Body mass index is 42.29 kg/m. Pt meets criteria for Obesity Class III based on current BMI.  Current diet order is Bariatric Clear Liquid. Patient is consuming approximately 2 fl oz over every 15 minutes, which is appropriate to receive adequate hydration and protein.  Labs and  medications reviewed.   Willey Blade, MS, RD, LDN Pager: [phone] After Hours Pager: [phone]

## 2016-10-03 NOTE — Progress Notes (Signed)
Pt to be discharged per MD order. IV removed. Instructions reviewed with pt and family, all questions answered. Family educated on lovenox shot administration and demonstrates successful teach back. Scripts given to pt. Will discharge in wheelchair.

## 2016-10-03 NOTE — Discharge Summary (Signed)
Physician Discharge Summary  Patient ID: Jessica Hampton MRN: [id number] DOB/AGE: [birth year] 46 y.o.  Admit date: 10/02/2016 Discharge date: 10/03/2016  Admission Diagnoses: Morbid Obesity  Discharge Diagnoses:  Active Problems:   Morbid obesity ([place])   Discharged Condition: good  Hospital Course: Unremarkable. Pt tolerated surgery and met discharge criteria the following day: adequate oral intake, pain/nausea controlled with oral medications, ambulating and voiding w/o difficulty  Consults: None  Significant Diagnostic Studies: labs: see chart  Treatments: IV hydration, antibiotics: mefoxin and surgery: sleeve gastrectomy and Hiatal hernia repair  Discharge Exam: Blood pressure 126/70, pulse 64, temperature 98.6 F (37 C), temperature source Oral, resp. rate 18, height '5\' 7"'$  (1.702 m), weight 122.5 kg (270 lb), last menstrual period 09/11/2016, SpO2 98 %. Incision/Wound:  Disposition: Final discharge disposition not confirmed  Discharge Instructions    Call MD for:  difficulty breathing, headache or visual disturbances    Complete by:  As directed    Call MD for:  extreme fatigue    Complete by:  As directed    Call MD for:  hives    Complete by:  As directed    Call MD for:  persistant dizziness or light-headedness    Complete by:  As directed    Call MD for:  persistant nausea and vomiting    Complete by:  As directed    Call MD for:  redness, tenderness, or signs of infection (pain, swelling, redness, odor or green/yellow discharge around incision site)    Complete by:  As directed    Call MD for:  severe uncontrolled pain    Complete by:  As directed    Call MD for:  temperature >100.4    Complete by:  As directed    Discharge instructions    Complete by:  As directed    Remember to start your chewable or liquid B complex once you arrive home and take this daily for 30 days. You may continue to take this beyond 30 days if you choose. Stick with a Clear liquid diet  for 2 days post operatively then you may advance to a Full Liquid diet for 12 days, which means you will be on a strict liquid diet for 14 days. Your daily goal is going to be to get in 60-80g of protein and 64oz of fluid.   Driving Restrictions    Complete by:  As directed    You may not drive until 24 hours past your last dose of pain medications.   Increase activity slowly    Complete by:  As directed    Lifting restrictions    Complete by:  As directed    Do not lift more than 10-15 pounds for 4-6 weeks post operatively   Other Restrictions    Complete by:  As directed    Avoid using your core muscles for 30 days after surgery - motions like pushing, pulling, climbing etc. Make sure to get up and walk frequently every day to help avoid developing blood clots.     Allergies as of 10/03/2016   No Known Allergies     Medication List    STOP taking these medications   metoprolol succinate 25 MG 24 hr tablet Commonly known as:  TOPROL-XL     TAKE these medications   B Complex-B12 Tabs Take 1 tablet by mouth daily.   enoxaparin 40 MG/0.4ML injection Commonly known as:  LOVENOX Inject 0.4 mLs (40 mg total) into the skin daily.  FLUoxetine 10 MG capsule Commonly known as:  PROZAC Take 10 mg by mouth every other day. BEDTIME   HYDROcodone-acetaminophen 7.5-325 mg/15 ml solution Commonly known as:  HYCET Take 15 mLs by mouth 4 (four) times daily as needed for moderate pain.   omeprazole 40 MG capsule Commonly known as:  PRILOSEC Take 1 capsule (40 mg total) by mouth daily.   ondansetron 4 MG disintegrating tablet Commonly known as:  ZOFRAN ODT Take 1 tablet (4 mg total) by mouth every 6 (six) hours as needed for nausea or vomiting.      This discharge summary was transcribed for Dr. Darnell Level.   Signed: Mardelle Matte 10/03/2016, 2:46 PM

## 2016-10-04 LAB — SURGICAL PATHOLOGY

## 2016-10-05 NOTE — Anesthesia Postprocedure Evaluation (Signed)
Anesthesia Post Note  Patient: Jessica Hampton  Procedure(s) Performed: Procedure(s) (LRB): LAPAROSCOPIC GASTRIC SLEEVE RESECTION WITH HIATAL HERNIA REPAIR (N/A)  Patient location during evaluation: PACU Anesthesia Type: MAC Level of consciousness: awake and alert Pain management: pain level controlled Vital Signs Assessment: post-procedure vital signs reviewed and stable Respiratory status: spontaneous breathing, nonlabored ventilation, respiratory function stable and patient connected to nasal cannula oxygen Cardiovascular status: blood pressure returned to baseline and stable Postop Assessment: no signs of nausea or vomiting Anesthetic complications: no     Last Vitals:  Vitals:   10/03/16 0054 10/03/16 0512  BP: 128/65 126/70  Pulse: 69 64  Resp: 16 18  Temp: 37.1 C 37 C    Last Pain:  Vitals:   10/03/16 1229  TempSrc:   PainSc: 2                  Martha Clan

## 2019-10-04 ENCOUNTER — Emergency Department: Payer: Managed Care, Other (non HMO)

## 2019-10-04 ENCOUNTER — Inpatient Hospital Stay
Admission: EM | Admit: 2019-10-04 | Discharge: 2019-10-06 | DRG: 066 | Disposition: A | Discharge status: Skilled Nursing Facility | Payer: Managed Care, Other (non HMO) | Attending: Internal Medicine | Admitting: Internal Medicine

## 2019-10-04 ENCOUNTER — Other Ambulatory Visit: Payer: Self-pay

## 2019-10-04 ENCOUNTER — Inpatient Hospital Stay: Payer: Managed Care, Other (non HMO)

## 2019-10-04 DIAGNOSIS — I639 Cerebral infarction, unspecified: Secondary | ICD-10-CM

## 2019-10-04 DIAGNOSIS — Z9884 Bariatric surgery status: Secondary | ICD-10-CM

## 2019-10-04 DIAGNOSIS — R29702 NIHSS score 2: Secondary | ICD-10-CM | POA: Diagnosis present

## 2019-10-04 DIAGNOSIS — Z6838 Body mass index (BMI) 38.0-38.9, adult: Secondary | ICD-10-CM | POA: Diagnosis not present

## 2019-10-04 DIAGNOSIS — K219 Gastro-esophageal reflux disease without esophagitis: Secondary | ICD-10-CM | POA: Diagnosis present

## 2019-10-04 DIAGNOSIS — Z8349 Family history of other endocrine, nutritional and metabolic diseases: Secondary | ICD-10-CM

## 2019-10-04 DIAGNOSIS — Z20822 Contact with and (suspected) exposure to covid-19: Secondary | ICD-10-CM | POA: Diagnosis present

## 2019-10-04 DIAGNOSIS — E282 Polycystic ovarian syndrome: Secondary | ICD-10-CM | POA: Diagnosis present

## 2019-10-04 DIAGNOSIS — Z8673 Personal history of transient ischemic attack (TIA), and cerebral infarction without residual deficits: Secondary | ICD-10-CM | POA: Diagnosis present

## 2019-10-04 DIAGNOSIS — I10 Essential (primary) hypertension: Secondary | ICD-10-CM | POA: Diagnosis present

## 2019-10-04 DIAGNOSIS — Z886 Allergy status to analgesic agent status: Secondary | ICD-10-CM | POA: Diagnosis not present

## 2019-10-04 DIAGNOSIS — R29810 Facial weakness: Secondary | ICD-10-CM | POA: Diagnosis present

## 2019-10-04 DIAGNOSIS — Z8249 Family history of ischemic heart disease and other diseases of the circulatory system: Secondary | ICD-10-CM | POA: Diagnosis not present

## 2019-10-04 DIAGNOSIS — I160 Hypertensive urgency: Secondary | ICD-10-CM

## 2019-10-04 DIAGNOSIS — I6389 Other cerebral infarction: Secondary | ICD-10-CM | POA: Diagnosis not present

## 2019-10-04 DIAGNOSIS — I63511 Cerebral infarction due to unspecified occlusion or stenosis of right middle cerebral artery: Secondary | ICD-10-CM | POA: Diagnosis not present

## 2019-10-04 DIAGNOSIS — I63519 Cerebral infarction due to unspecified occlusion or stenosis of unspecified middle cerebral artery: Secondary | ICD-10-CM | POA: Diagnosis not present

## 2019-10-04 DIAGNOSIS — R4701 Aphasia: Secondary | ICD-10-CM | POA: Diagnosis present

## 2019-10-04 DIAGNOSIS — Z9889 Other specified postprocedural states: Secondary | ICD-10-CM

## 2019-10-04 HISTORY — DX: Hypertensive urgency: I16.0

## 2019-10-04 HISTORY — DX: Cerebral infarction, unspecified: I63.9

## 2019-10-04 LAB — TROPONIN I (HIGH SENSITIVITY)
Troponin I (High Sensitivity): 4 ng/L (ref <18)
Troponin I (High Sensitivity): 4 ng/L (ref <18)

## 2019-10-04 LAB — DIFFERENTIAL
Abs Immature Granulocytes: 0.03 10*3/uL (ref 0.00–0.07)
Basophils Absolute: 0 10*3/uL (ref 0.0–0.1)
Basophils Relative: 1 %
Eosinophils Absolute: 0.1 10*3/uL (ref 0.0–0.5)
Eosinophils Relative: 1 %
Immature Granulocytes: 0 %
Lymphocytes Relative: 30 %
Lymphs Abs: 2.4 10*3/uL (ref 0.7–4.0)
Monocytes Absolute: 0.5 10*3/uL (ref 0.1–1.0)
Monocytes Relative: 6 %
Neutro Abs: 5.1 10*3/uL (ref 1.7–7.7)
Neutrophils Relative %: 62 %

## 2019-10-04 LAB — COMPREHENSIVE METABOLIC PANEL
ALT: 14 U/L (ref 0–44)
AST: 19 U/L (ref 15–41)
Albumin: 4.1 g/dL (ref 3.5–5.0)
Alkaline Phosphatase: 60 U/L (ref 38–126)
Anion gap: 10 (ref 5–15)
BUN: 12 mg/dL (ref 6–20)
CO2: 24 mmol/L (ref 22–32)
Calcium: 8.8 mg/dL — ABNORMAL LOW (ref 8.9–10.3)
Chloride: 102 mmol/L (ref 98–111)
Creatinine, Ser: 0.57 mg/dL (ref 0.44–1.00)
GFR calc Af Amer: >60 mL/min (ref >60)
GFR calc non Af Amer: >60 mL/min (ref >60)
Glucose, Bld: 101 mg/dL — ABNORMAL HIGH (ref 70–99)
Potassium: 3.5 mmol/L (ref 3.5–5.1)
Sodium: 136 mmol/L (ref 135–145)
Total Bilirubin: 0.5 mg/dL (ref 0.3–1.2)
Total Protein: 7.2 g/dL (ref 6.5–8.1)

## 2019-10-04 LAB — PROTIME-INR
INR: 0.9 (ref 0.8–1.2)
Prothrombin Time: 12.2 seconds (ref 11.4–15.2)

## 2019-10-04 LAB — CBC
HCT: 36.3 % (ref 36.0–46.0)
Hemoglobin: 11.1 g/dL — ABNORMAL LOW (ref 12.0–15.0)
MCH: 24 pg — ABNORMAL LOW (ref 26.0–34.0)
MCHC: 30.6 g/dL (ref 30.0–36.0)
MCV: 78.6 fL — ABNORMAL LOW (ref 80.0–100.0)
Platelets: 219 10*3/uL (ref 150–400)
RBC: 4.62 MIL/uL (ref 3.87–5.11)
RDW: 18.3 % — ABNORMAL HIGH (ref 11.5–15.5)
WBC: 8.1 10*3/uL (ref 4.0–10.5)
nRBC: 0 % (ref 0.0–0.2)

## 2019-10-04 LAB — GLUCOSE, CAPILLARY: Glucose-Capillary: 89 mg/dL (ref 70–99)

## 2019-10-04 LAB — PREGNANCY, URINE: Preg Test, Ur: NEGATIVE

## 2019-10-04 LAB — APTT: aPTT: 25 seconds (ref 24–36)

## 2019-10-04 LAB — SARS CORONAVIRUS 2 (TAT 6-24 HRS): SARS Coronavirus 2: NEGATIVE

## 2019-10-04 MED ORDER — STROKE: EARLY STAGES OF RECOVERY BOOK
Freq: Once | Status: AC
  Filled 2019-10-04: qty 1

## 2019-10-04 MED ORDER — BUPROPION HCL ER (XL) 150 MG PO TB24
150.0000 mg | ORAL_TABLET | Freq: Every day | ORAL | Status: DC
  Administered 2019-10-04 – 2019-10-06 (×3): 150 mg via ORAL
  Filled 2019-10-04 (×3): qty 1

## 2019-10-04 MED ORDER — MAGNESIUM OXIDE 400 MG PO TABS
400.0000 mg | ORAL_TABLET | Freq: Every day | ORAL | Status: DC
  Administered 2019-10-04 – 2019-10-06 (×3): 400 mg via ORAL
  Filled 2019-10-04 (×5): qty 1

## 2019-10-04 MED ORDER — IOHEXOL 350 MG/ML SOLN
75.0000 mL | Freq: Once | INTRAVENOUS | Status: AC | PRN
  Administered 2019-10-04: 75 mL via INTRAVENOUS

## 2019-10-04 MED ORDER — ASPIRIN 81 MG PO CHEW
324.0000 mg | CHEWABLE_TABLET | Freq: Once | ORAL | Status: AC
  Administered 2019-10-04: 324 mg via ORAL
  Filled 2019-10-04: qty 4

## 2019-10-04 MED ORDER — ACETAMINOPHEN 650 MG RE SUPP: 650.0000 mg | RECTAL | Status: DC | PRN

## 2019-10-04 MED ORDER — FLUOXETINE HCL 20 MG PO CAPS
20.0000 mg | ORAL_CAPSULE | Freq: Every day | ORAL | Status: DC
  Administered 2019-10-05 – 2019-10-06 (×2): 20 mg via ORAL
  Filled 2019-10-04 (×2): qty 1

## 2019-10-04 MED ORDER — LABETALOL HCL 5 MG/ML IV SOLN
5.0000 mg | Freq: Once | INTRAVENOUS | Status: AC
  Administered 2019-10-04: 5 mg via INTRAVENOUS
  Filled 2019-10-04: qty 4

## 2019-10-04 MED ORDER — SENNOSIDES-DOCUSATE SODIUM 8.6-50 MG PO TABS: 2.0000 | ORAL_TABLET | Freq: Every evening | ORAL | Status: DC | PRN

## 2019-10-04 MED ORDER — CLOPIDOGREL BISULFATE 75 MG PO TABS
75.0000 mg | ORAL_TABLET | Freq: Every day | ORAL | Status: DC
  Administered 2019-10-04 – 2019-10-06 (×3): 75 mg via ORAL
  Filled 2019-10-04 (×3): qty 1

## 2019-10-04 MED ORDER — SODIUM CHLORIDE 0.9 % IV SOLN: INTRAVENOUS | Status: DC

## 2019-10-04 MED ORDER — ADULT MULTIVITAMIN W/MINERALS CH
1.0000 | ORAL_TABLET | Freq: Every day | ORAL | Status: DC
  Administered 2019-10-04 – 2019-10-06 (×3): 1 via ORAL
  Filled 2019-10-04 (×3): qty 1

## 2019-10-04 MED ORDER — ACETAMINOPHEN 325 MG PO TABS
650.0000 mg | ORAL_TABLET | ORAL | Status: DC | PRN
  Administered 2019-10-04: 650 mg via ORAL
  Filled 2019-10-04: qty 2

## 2019-10-04 MED ORDER — ATORVASTATIN CALCIUM 20 MG PO TABS
80.0000 mg | ORAL_TABLET | Freq: Every day | ORAL | Status: DC
  Administered 2019-10-04: 80 mg via ORAL
  Filled 2019-10-04: qty 4

## 2019-10-04 MED ORDER — ACETAMINOPHEN 160 MG/5ML PO SOLN
650.0000 mg | ORAL | Status: DC | PRN
  Filled 2019-10-04: qty 20.3

## 2019-10-04 MED ORDER — ASPIRIN EC 81 MG PO TBEC
81.0000 mg | DELAYED_RELEASE_TABLET | Freq: Every day | ORAL | Status: DC
  Administered 2019-10-04 – 2019-10-06 (×3): 81 mg via ORAL
  Filled 2019-10-04 (×3): qty 1

## 2019-10-04 NOTE — ED Notes (Signed)
Pt resting quietly on ER stretcher, NAD noted, no change in neuro status.  Continue to await arrival of neurologist.

## 2019-10-04 NOTE — H&P (Signed)
History and Physical    Jessica Hampton [id number] DOB: 1970/09/10 DOA: 10/04/2019  PCP: Modesto Charon, MD   Patient coming from: Home  I have personally briefly reviewed patient's old medical records in [place]  Chief Complaint: Difficulty speaking  HPI: Jessica Hampton is a 49 y.o. female with medical history significant for hypertension and morbid obesity status post gastric sleeve in 2018 who was brought into the emergency room by EMS after she developed sudden onset  difficulty speaking started around 8:30 AM.  Her husband also noted a left facial droop.  She had no lower extremity weakness or numbness, denies any vision changes or difficulty swallowing but has had a frontal headache for last couple of days.  She rated her headache about a 2/10 in intensity.  Patient states that she was on 2 antihypertensive medications that were discontinued after she had her gastric surgery as her blood pressure had improved. She denies having any chest pain, nausea, vomiting, diaphoresis or any changes in her bowel habits.  She had an initial CT scan of the head without contrast showed 3 small areas of acute infarction involving the MCA territory.  She had a CT angiogram which showed 5 mm segment of high-grade stenosis of the right M1 MCA.  She did not receive TPA because her symptoms had resolved in the emergency room  ED Course: 49 year old female with a history of hypertension who presented as a code stroke and her symptoms were acute onset aphasia which started around 8:30 AM and a left facial droop.  She was hypertensive she arrived in the emergency room, was able to speak but had difficulty with word finding.  Code stroke was activated.  CT scan of the head was negative for intracranial hemorrhage but showed areas of acute infarction.  Patient received aspirin and IV labetalol in the emergency room.  Neurology was consulted  Review of Systems: As per HPI otherwise 10 point review of  systems negative.    Past Medical History:  Diagnosis Date  . Anemia   . Complication of anesthesia   . Family history of adverse reaction to anesthesia    MOM NAUSEATED  . GERD (gastroesophageal reflux disease)    OCC  . Hypertension   . PONV (postoperative nausea and vomiting)    NAUSEATED    Past Surgical History:  Procedure Laterality Date  . LAPAROSCOPIC GASTRIC SLEEVE RESECTION WITH HIATAL HERNIA REPAIR N/A 10/02/2016   Procedure: LAPAROSCOPIC GASTRIC SLEEVE RESECTION WITH HIATAL HERNIA REPAIR;  Surgeon: Bonner Puna, MD;  Location: [facility] ORS;  Service: General;  Laterality: N/A;  . TUBAL LIGATION  2004     reports that she has never smoked. She has never used smokeless tobacco. She reports that she does not drink alcohol or use drugs.  Allergies  Allergen Reactions  . Nsaids Other (See Comments)    History of gastric bypass     Family History  Problem Relation Age of Onset  . Hypertension Mother   . Hyperlipidemia Father   . Hypertension Father      Prior to Admission medications   Medication Sig Start Date End Date Taking? Authorizing Provider  buPROPion (WELLBUTRIN XL) 150 MG 24 hr tablet Take 150 mg by mouth daily. 08/09/19  Yes [provider]  FLUoxetine (PROZAC) 20 MG capsule Take 20 mg by mouth daily.    Yes [provider]  magnesium oxide (MAG-OX) 400 MG tablet Take 400 mg by mouth daily.   Yes  [provider]  Multiple Vitamins-Minerals (BARIATRIC MULTIVITAMINS/IRON PO) Take 1 tablet by mouth daily.   Yes [provider]    Physical Exam: Vitals:   10/04/19 1010 10/04/19 1015 10/04/19 1030 10/04/19 1045  BP: (!) 150/95 (!) 148/104 (!) 160/94 (!) 160/99  Pulse: 85 85 85 81  Resp: 16 15 12 17   Temp:      TempSrc:      SpO2: 95% 96% 94% 94%  Weight:      Height:         Vitals:   10/04/19 1010 10/04/19 1015 10/04/19 1030 10/04/19 1045  BP: (!) 150/95 (!) 148/104 (!) 160/94 (!) 160/99  Pulse: 85 85 85 81    Resp: 16 15 12 17   Temp:      TempSrc:      SpO2: 95% 96% 94% 94%  Weight:      Height:        Constitutional: NAD, alert and oriented  Eyes: PERRL, lids and conjunctivae normal ENMT: Mucous membranes are moist.  Neck: normal, supple, no masses, no thyromegaly Respiratory: clear to auscultation bilaterally, no wheezing, no crackles. Normal respiratory effort. No accessory muscle use.  Cardiovascular: Regular rate and rhythm, no murmurs / rubs / gallops. No extremity edema. 2+ pedal pulses. No carotid bruits.  Abdomen: no tenderness, no masses palpated. No hepatosplenomegaly. Bowel sounds positive.  Musculoskeletal: no clubbing / cyanosis. No joint deformity upper and lower extremities.  Skin: no rashes, lesions, ulcers.  Neurologic: No gross focal neurologic deficit.  Able to move all extremities Psychiatric: Normal mood and affect.   Labs on Admission: I have personally reviewed following labs and imaging studies  CBC: Recent Labs  Lab 10/04/19 0917  WBC 8.1  NEUTROABS 5.1  HGB 11.1*  HCT 36.3  MCV 78.6*  PLT [id number]   Basic Metabolic Panel: Recent Labs  Lab 10/04/19 0917  NA 136  K 3.5  CL 102  CO2 24  GLUCOSE 101*  BUN 12  CREATININE 0.57  CALCIUM 8.8*   GFR: Estimated Creatinine Clearance: 112 mL/min (by C-G formula based on SCr of 0.57 mg/dL). Liver Function Tests: Recent Labs  Lab 10/04/19 0917  AST 19  ALT 14  ALKPHOS 60  BILITOT 0.5  PROT 7.2  ALBUMIN 4.1   No results for input(s): LIPASE, AMYLASE in the last 168 hours. No results for input(s): AMMONIA in the last 168 hours. Coagulation Profile: Recent Labs  Lab 10/04/19 0917  INR 0.9   Cardiac Enzymes: No results for input(s): CKTOTAL, CKMB, CKMBINDEX, TROPONINI in the last 168 hours. BNP (last 3 results) No results for input(s): PROBNP in the last 8760 hours. HbA1C: No results for input(s): HGBA1C in the last 72 hours. CBG: Recent Labs  Lab 10/04/19 0920  GLUCAP 89   Lipid  Profile: No results for input(s): CHOL, HDL, LDLCALC, TRIG, CHOLHDL, LDLDIRECT in the last 72 hours. Thyroid Function Tests: No results for input(s): TSH, T4TOTAL, FREET4, T3FREE, THYROIDAB in the last 72 hours. Anemia Panel: No results for input(s): VITAMINB12, FOLATE, FERRITIN, TIBC, IRON, RETICCTPCT in the last 72 hours. Urine analysis:    Component Value Date/Time   LABSPEC 1.015 04/09/2007 0942   PHURINE 7.0 04/09/2007 0942   HGBUR trace-lysed 04/09/2007 0942   BILIRUBINUR negative 04/09/2007 0942   UROBILINOGEN negative 04/09/2007 0942   NITRITE negative 04/09/2007 0942    Radiological Exams on Admission: CT Angio Head W or Wo Contrast  Result Date: 10/04/2019 CLINICAL DATA:  Code stroke follow-up EXAM:  CT ANGIOGRAPHY HEAD AND NECK TECHNIQUE: Multidetector CT imaging of the head and neck was performed using the standard protocol during bolus administration of intravenous contrast. Multiplanar CT image reconstructions and MIPs were obtained to evaluate the vascular anatomy. Carotid stenosis measurements (when applicable) are obtained utilizing NASCET criteria, using the distal internal carotid diameter as the denominator. CONTRAST:  24mL OMNIPAQUE IOHEXOL 350 MG/ML SOLN COMPARISON:  None. FINDINGS: CTA NECK FINDINGS Aortic arch: Great vessel origins are patent. Right carotid system: Common, internal, and external carotid arteries are patent. There is no measurable stenosis or evidence of dissection. Left carotid system: Common, internal, and external carotid arteries are patent. There is no measurable stenosis or evidence of dissection. Vertebral arteries: Patent. No measurable stenosis or evidence of dissection. Skeleton: No significant abnormality. Other neck: No mass or adenopathy. Upper chest: No apical lung mass. Review of the MIP images confirms the above findings CTA HEAD FINDINGS Anterior circulation: Intracranial internal carotid arteries are patent with minor calcified plaque on the  right. Anterior and middle cerebral arteries are patent. There is a 5 mm segment of high-grade stenosis of the right M1 MCA. Posterior circulation: Intracranial vertebral arteries, basilar artery, and posterior cerebral arteries are patent. Venous sinuses: As permitted by contrast timing, patent. Review of the MIP images confirms the above findings IMPRESSION: Suboptimal contrast bolus timing. No proximal intracranial vessel occlusion. 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 No hemodynamically significant stenosis in the neck. Electronically Signed   By: Macy Mis M.D.   On: 10/04/2019 10:08   CT Angio Neck W and/or Wo Contrast  Result Date: 10/04/2019 CLINICAL DATA:  Code stroke follow-up EXAM: CT ANGIOGRAPHY HEAD AND NECK TECHNIQUE: Multidetector CT imaging of the head and neck was performed using the standard protocol during bolus administration of intravenous contrast. Multiplanar CT image reconstructions and MIPs were obtained to evaluate the vascular anatomy. Carotid stenosis measurements (when applicable) are obtained utilizing NASCET criteria, using the distal internal carotid diameter as the denominator. CONTRAST:  59mL OMNIPAQUE IOHEXOL 350 MG/ML SOLN COMPARISON:  None. FINDINGS: CTA NECK FINDINGS Aortic arch: Great vessel origins are patent. Right carotid system: Common, internal, and external carotid arteries are patent. There is no measurable stenosis or evidence of dissection. Left carotid system: Common, internal, and external carotid arteries are patent. There is no measurable stenosis or evidence of dissection. Vertebral arteries: Patent. No measurable stenosis or evidence of dissection. Skeleton: No significant abnormality. Other neck: No mass or adenopathy. Upper chest: No apical lung mass. Review of the MIP images confirms the above findings CTA HEAD FINDINGS Anterior circulation: Intracranial internal carotid arteries are patent with minor calcified plaque on the right.  Anterior and middle cerebral arteries are patent. There is a 5 mm segment of high-grade stenosis of the right M1 MCA. Posterior circulation: Intracranial vertebral arteries, basilar artery, and posterior cerebral arteries are patent. Venous sinuses: As permitted by contrast timing, patent. Review of the MIP images confirms the above findings IMPRESSION: Suboptimal contrast bolus timing. No proximal intracranial vessel occlusion. 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 No hemodynamically significant stenosis in the neck. Electronically Signed   By: Macy Mis M.D.   On: 10/04/2019 10:08   CT HEAD CODE STROKE WO CONTRAST  Result Date: 10/04/2019 CLINICAL DATA:  Code stroke.  Difficulty speaking, facial droop EXAM: CT HEAD WITHOUT CONTRAST TECHNIQUE: Contiguous axial images were obtained from the base of the skull through the vertex without intravenous contrast. COMPARISON:  None. FINDINGS: Brain: There is  no acute intracranial hemorrhage. There are a few small areas of hypoattenuation in the right MCA territory including the right caudate, inferior right temporal gyrus, and lateral right frontal lobe probably involving the precentral gyrus in the facial motor region. There is no acute intracranial hemorrhage. There is mild effacement of the right frontal horn. No hydrocephalus. No extra-axial fluid collection. Vascular: There is no hyperdense vessel. Skull: Unremarkable. Sinuses/Orbits: Aerated.  Unremarkable. Other: Mastoid air cells are clear. ASPECTS [facility] Stroke Program Early CT Score) - Ganglionic level infarction (caudate, lentiform nuclei, internal capsule, insula, M1-M3 cortex): 4 - Supraganglionic infarction (M4-M6 cortex): 3 Total score (0-10 with 10 being normal): 7 IMPRESSION: No acute intracranial hemorrhage. Three small areas of acute infarction within the MCA territory. ASPECT score is 7. These results were called by telephone at the time of interpretation on 10/04/2019 at 9:37 am to  provider [facility] , who verbally acknowledged these results. Electronically Signed   By: Macy Mis M.D.   On: 10/04/2019 09:45    EKG: Independently reviewed. Sinus tachycardia  Assessment/Plan Principal Problem:   CVA (cerebral vascular accident) [facility]) Active Problems:   Morbid obesity ([place])   Hypertensive urgency    Acute CVA Patient presents for evaluation of acute aphasia which has improved which was associated with left facial droop CT scan of the head without contrast shows 3 small infarcts in the  MCA territory Will allow for permissive hypertension Start patient on aspirin and Plavix as well as high intensity statins Obtain MRI of the brain without contrast Will request physical therapy/speech therapy/occupational therapy consult Consult neurology   Morbid obesity (BMI [id number] kg/m2) Complicates overall prognosis and care   Hypertensive urgency Patient had significantly elevated blood pressure upon arrival to the ER 178/107 and she received a dose of labetalol IV in the emergency room  Will allow for permissive hypertension  DVT prophylaxis: SCD Code Status: Full code Family Communication: Plan of care was discussed with patient in detail. She verbalizes understanding and agrees with the plan  Disposition Plan: Back to previous home environment Consults called:  Neurology    Collier Bullock MD [facility]     10/04/2019, 11:53 AM

## 2019-10-04 NOTE — ED Notes (Signed)
Dr. Cherylann Banas spoke with neurology via telephone for update on pt condition and CT read.  Neurologist states he is on the way to see pt and that she is not tPa candidate.  Pt updated on above information and need to remain in hospital for admission.  Pt verbalizes understanding of all information shared.  Will continue to monitor.

## 2019-10-04 NOTE — Consult Note (Signed)
Reason for Consult: aphasia  Requesting Physician: Dr. Cherylann Banas    CC: aphasia    HPI: Jessica Hampton is an 49 y.o. female present female with hx of gastric sleeve, HTN. Ps states she woke up this AM and walked her dogs. At about 8:30 AM patient had difficulty getting words out and was noted by her husband. Patient was complaining of diffuse pressure like headache.   No improved and close to baseline. NIHSS of 0 Headache 2/10 currently. No anti platelet therapy prior.    Past Medical History:  Diagnosis Date  . Anemia   . Complication of anesthesia   . Family history of adverse reaction to anesthesia    MOM NAUSEATED  . GERD (gastroesophageal reflux disease)    OCC  . Hypertension   . PONV (postoperative nausea and vomiting)    NAUSEATED    Past Surgical History:  Procedure Laterality Date  . LAPAROSCOPIC GASTRIC SLEEVE RESECTION WITH HIATAL HERNIA REPAIR N/A 10/02/2016   Procedure: LAPAROSCOPIC GASTRIC SLEEVE RESECTION WITH HIATAL HERNIA REPAIR;  Surgeon: Bonner Puna, MD;  Location: [facility] ORS;  Service: General;  Laterality: N/A;  . TUBAL LIGATION  2004    Family History  Problem Relation Age of Onset  . Hypertension Mother   . Hyperlipidemia Father   . Hypertension Father     Social History:  reports that she has never smoked. She has never used smokeless tobacco. She reports that she does not drink alcohol or use drugs.  Allergies  Allergen Reactions  . Nsaids Other (See Comments)    History of gastric bypass     Medications: I have reviewed the patient's current medications.  ROS: History obtained from the patient  General ROS: negative for - chills, fatigue, fever, night sweats, weight gain or weight loss Psychological ROS: negative for - behavioral disorder, hallucinations, memory difficulties, mood swings or suicidal ideation Ophthalmic ROS: negative for - blurry vision, double vision, eye pain or loss of vision ENT ROS: negative for - epistaxis, nasal  discharge, oral lesions, sore throat, tinnitus or vertigo Allergy and Immunology ROS: negative for - hives or itchy/watery eyes Hematological and Lymphatic ROS: negative for - bleeding problems, bruising or swollen lymph nodes Endocrine ROS: negative for - galactorrhea, hair pattern changes, polydipsia/polyuria or temperature intolerance Respiratory ROS: negative for - cough, hemoptysis, shortness of breath or wheezing Cardiovascular ROS: negative for - chest pain, dyspnea on exertion, edema or irregular heartbeat Gastrointestinal ROS: negative for - abdominal pain, diarrhea, hematemesis, nausea/vomiting or stool incontinence Genito-Urinary ROS: negative for - dysuria, hematuria, incontinence or urinary frequency/urgency Musculoskeletal ROS: negative for - joint swelling or muscular weakness Neurological ROS: as noted in HPI Dermatological ROS: negative for rash and skin lesion changes  Physical Examination: Blood pressure (!) 160/99, pulse 81, temperature 98.6 F (37 C), temperature source Oral, resp. rate 17, height 5\' 7"  (1.702 m), weight 111.6 kg, SpO2 94 %.    Neurological Examination   Mental Status: Alert, oriented, thought content appropriate.  Speech fluent without evidence of aphasia.  Able to follow 3 step commands without difficulty. Cranial Nerves: II: Discs flat bilaterally; Visual fields grossly normal, pupils equal, round, reactive to light and accommodation III,IV, VI: ptosis not present, extra-ocular motions intact bilaterally V,VII: smile symmetric, facial light touch sensation normal bilaterally VIII: hearing normal bilaterally IX,X: gag reflex present XI: bilateral shoulder shrug XII: midline tongue extension Motor: Right : Upper extremity   5/5    Left:     Upper extremity  5/5  Lower extremity   5/5     Lower extremity   5/5 Tone and bulk:normal tone throughout; no atrophy noted Sensory: Pinprick and light touch intact throughout, bilaterally Deep Tendon  Reflexes: 2+ and symmetric throughout Plantars: Right: downgoing   Left: downgoing Cerebellar: normal finger-to-nose, normal rapid alternating movements and normal heel-to-shin test Gait: not tested      Laboratory Studies:   Basic Metabolic Panel: Recent Labs  Lab 10/04/19 0917  NA 136  K 3.5  CL 102  CO2 24  GLUCOSE 101*  BUN 12  CREATININE 0.57  CALCIUM 8.8*    Liver Function Tests: Recent Labs  Lab 10/04/19 0917  AST 19  ALT 14  ALKPHOS 60  BILITOT 0.5  PROT 7.2  ALBUMIN 4.1   No results for input(s): LIPASE, AMYLASE in the last 168 hours. No results for input(s): AMMONIA in the last 168 hours.  CBC: Recent Labs  Lab 10/04/19 0917  WBC 8.1  NEUTROABS 5.1  HGB 11.1*  HCT 36.3  MCV 78.6*  PLT 219    Cardiac Enzymes: No results for input(s): CKTOTAL, CKMB, CKMBINDEX, TROPONINI in the last 168 hours.  BNP: Invalid input(s): POCBNP  CBG: Recent Labs  Lab 10/04/19 0920  GLUCAP 4    Microbiology: No results found for this or any previous visit.  Coagulation Studies: Recent Labs    10/04/19 0917  LABPROT 12.2  INR 0.9    Urinalysis: No results for input(s): COLORURINE, LABSPEC, PHURINE, GLUCOSEU, HGBUR, BILIRUBINUR, KETONESUR, PROTEINUR, UROBILINOGEN, NITRITE, LEUKOCYTESUR in the last 168 hours.  Invalid input(s): APPERANCEUR  Lipid Panel:  No results found for: CHOL, TRIG, HDL, CHOLHDL, VLDL, LDLCALC  HgbA1C: No results found for: HGBA1C  Urine Drug Screen:  No results found for: LABOPIA, COCAINSCRNUR, LABBENZ, AMPHETMU, THCU, LABBARB  Alcohol Level: No results for input(s): ETH in the last 168 hours.  Other results: EKG: normal EKG, normal sinus rhythm, unchanged from previous tracings.  Imaging: CT Angio Head W or Wo Contrast  Result Date: 10/04/2019 CLINICAL DATA:  Code stroke follow-up EXAM: CT ANGIOGRAPHY HEAD AND NECK TECHNIQUE: Multidetector CT imaging of the head and neck was performed using the standard protocol during  bolus administration of intravenous contrast. Multiplanar CT image reconstructions and MIPs were obtained to evaluate the vascular anatomy. Carotid stenosis measurements (when applicable) are obtained utilizing NASCET criteria, using the distal internal carotid diameter as the denominator. CONTRAST:  65mL OMNIPAQUE IOHEXOL 350 MG/ML SOLN COMPARISON:  None. FINDINGS: CTA NECK FINDINGS Aortic arch: Great vessel origins are patent. Right carotid system: Common, internal, and external carotid arteries are patent. There is no measurable stenosis or evidence of dissection. Left carotid system: Common, internal, and external carotid arteries are patent. There is no measurable stenosis or evidence of dissection. Vertebral arteries: Patent. No measurable stenosis or evidence of dissection. Skeleton: No significant abnormality. Other neck: No mass or adenopathy. Upper chest: No apical lung mass. Review of the MIP images confirms the above findings CTA HEAD FINDINGS Anterior circulation: Intracranial internal carotid arteries are patent with minor calcified plaque on the right. Anterior and middle cerebral arteries are patent. There is a 5 mm segment of high-grade stenosis of the right M1 MCA. Posterior circulation: Intracranial vertebral arteries, basilar artery, and posterior cerebral arteries are patent. Venous sinuses: As permitted by contrast timing, patent. Review of the MIP images confirms the above findings IMPRESSION: Suboptimal contrast bolus timing. No proximal intracranial vessel occlusion. 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 No hemodynamically  significant stenosis in the neck. Electronically Signed   By: Macy Mis M.D.   On: 10/04/2019 10:08   CT Angio Neck W and/or Wo Contrast  Result Date: 10/04/2019 CLINICAL DATA:  Code stroke follow-up EXAM: CT ANGIOGRAPHY HEAD AND NECK TECHNIQUE: Multidetector CT imaging of the head and neck was performed using the standard protocol during bolus  administration of intravenous contrast. Multiplanar CT image reconstructions and MIPs were obtained to evaluate the vascular anatomy. Carotid stenosis measurements (when applicable) are obtained utilizing NASCET criteria, using the distal internal carotid diameter as the denominator. CONTRAST:  5mL OMNIPAQUE IOHEXOL 350 MG/ML SOLN COMPARISON:  None. FINDINGS: CTA NECK FINDINGS Aortic arch: Great vessel origins are patent. Right carotid system: Common, internal, and external carotid arteries are patent. There is no measurable stenosis or evidence of dissection. Left carotid system: Common, internal, and external carotid arteries are patent. There is no measurable stenosis or evidence of dissection. Vertebral arteries: Patent. No measurable stenosis or evidence of dissection. Skeleton: No significant abnormality. Other neck: No mass or adenopathy. Upper chest: No apical lung mass. Review of the MIP images confirms the above findings CTA HEAD FINDINGS Anterior circulation: Intracranial internal carotid arteries are patent with minor calcified plaque on the right. Anterior and middle cerebral arteries are patent. There is a 5 mm segment of high-grade stenosis of the right M1 MCA. Posterior circulation: Intracranial vertebral arteries, basilar artery, and posterior cerebral arteries are patent. Venous sinuses: As permitted by contrast timing, patent. Review of the MIP images confirms the above findings IMPRESSION: Suboptimal contrast bolus timing. No proximal intracranial vessel occlusion. 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 No hemodynamically significant stenosis in the neck. Electronically Signed   By: Macy Mis M.D.   On: 10/04/2019 10:08   CT HEAD CODE STROKE WO CONTRAST  Result Date: 10/04/2019 CLINICAL DATA:  Code stroke.  Difficulty speaking, facial droop EXAM: CT HEAD WITHOUT CONTRAST TECHNIQUE: Contiguous axial images were obtained from the base of the skull through the vertex without  intravenous contrast. COMPARISON:  None. FINDINGS: Brain: There is no acute intracranial hemorrhage. There are a few small areas of hypoattenuation in the right MCA territory including the right caudate, inferior right temporal gyrus, and lateral right frontal lobe probably involving the precentral gyrus in the facial motor region. There is no acute intracranial hemorrhage. There is mild effacement of the right frontal horn. No hydrocephalus. No extra-axial fluid collection. Vascular: There is no hyperdense vessel. Skull: Unremarkable. Sinuses/Orbits: Aerated.  Unremarkable. Other: Mastoid air cells are clear. ASPECTS [facility] Stroke Program Early CT Score) - Ganglionic level infarction (caudate, lentiform nuclei, internal capsule, insula, M1-M3 cortex): 4 - Supraganglionic infarction (M4-M6 cortex): 3 Total score (0-10 with 10 being normal): 7 IMPRESSION: No acute intracranial hemorrhage. Three small areas of acute infarction within the MCA territory. ASPECT score is 7. These results were called by telephone at the time of interpretation on 10/04/2019 at 9:37 am to provider [facility] , who verbally acknowledged these results. Electronically Signed   By: Macy Mis M.D.   On: 10/04/2019 09:45     Assessment/Plan:  49 y.o. female present female with hx of gastric sleeve, HTN. Ps states she woke up this AM and walked her dogs. At about 8:30 AM patient had difficulty getting words out and was noted by her husband. Patient was complaining of diffuse pressure like headache.   No improved and close to baseline. NIHSS of 0 Headache 2/10 currently. No anti platelet therapy prior.    -  CTH with possible subcortical ischemia on R side (MCA distribution) - Not tpa candidate given the resolution or near resolution of symptoms along with CTH findings - CTH with R M1 stenosis  - Needs MRI brain - ASA 81mg  daily and if possible would also start Plavix 75 daily for that high grade R M1 stenosis -  pt/ot/speech therapy - admission  - will follow  10/04/2019, 11:32 AM

## 2019-10-04 NOTE — ED Notes (Signed)
Called code stroke  To [phone]

## 2019-10-04 NOTE — Evaluation (Signed)
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 PMH includes HTN, obesity post gastric sleeve in 2018  Clinical Impression  Pt is a 49 yo female admitted for CVA. Pt received resting in bed with nursing present. Pt agreeable to PT eval. Pt reports living in a one level home with her husband who works from home and their daughter. 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 Pt states all speech difficulties from this morning have resolved and denies any visual or sensation changes. Strength WFL (5/5) bilateral UE/LE. Pt ambulated without AD two laps around nurses station with no LOB or unsteadiness noted and normal gait speed. DGI performed and pt scored 24/24. Pt safe ambulating with reciprocal gait pattern up/down flight of stairs without use of handrail. Pt able to maintain static standing balance on single leg bilaterally 30 sec and tandem stance eyes closed for 30 sec with no unsteadiness or LOB. Pt educated on BEFAST acronym for stroke signs/symptoms and on modifiable risk factors for stroke including hypertension, diet and physical activity. Pt encouraged to increase physical activity throughout her day in order to meet recommended guidelines. Pt verbalizing understanding. Pt reports having no concerns over returning home and feels like she is at baseline. At this time pt demonstrating baseline level of functioning and is independent with functional mobility. No acute PT needs at this time and PT signing off. No PT follow up recommended at this time. PT will reassess with any change in status and new PT orders.     Follow Up Recommendations No PT follow up    Equipment Recommendations  None recommended by PT     Recommendations for Other Services       Precautions / Restrictions Precautions Precautions: None Restrictions Weight Bearing Restrictions: No      Mobility  Bed Mobility Overal bed mobility: Independent                Transfers Overall transfer level: Independent               General transfer comment: steady with STS from bed and recliner  Ambulation/Gait Ambulation/Gait assistance: Independent Gait Distance (Feet): 360 Feet Assistive device: None Gait Pattern/deviations: WFL(Within Functional Limits) Gait velocity: normal   General Gait Details: steady with normal gait speed with good step length and height, pt able to perform changes in direction,step over obstacles, vertical/horizontal head turns and pivot turns  Stairs Stairs: Yes Stairs assistance: Independent Stair Management: No rails;Alternating pattern Number of Stairs: 8 General stair comments: pt safe with stairs, did not need use of handrail, no LOB or unsteadiness noted  Wheelchair Mobility    Modified Rankin (Stroke Patients Only)       Balance Overall balance assessment: Independent(able to pick up obstacles from floor)                               Standardized Balance Assessment Standardized Balance Assessment : Dynamic Gait Index   Dynamic Gait Index Level Surface: Normal Change in Gait Speed: Normal Gait with Horizontal Head Turns: Normal Gait with Vertical Head Turns: Normal Gait and Pivot Turn: Normal Step Over Obstacle: Normal Step Around Obstacles: Normal Steps: Normal Total  Score: 24       Pertinent Vitals/Pain Pain Assessment: No/denies pain    Home Living Family/patient expects to be discharged to:: Private residence Living Arrangements: Spouse/significant other;Children(18 yo daughter, husband works from home) Available Help at Discharge: Family;Available 24 hours/day Type of Home: House Home Access: Stairs to enter Entrance Stairs-Rails:  Can reach both(BHR in front, single hand rail in back) Entrance Stairs-Number of Steps: 3 in front 2 in back Home Layout: One level Home Equipment: None      Prior Function Level of Independence: Independent         Comments: pt previously ind with all ADLs/IADLs, works as Systems analyst        Extremity/Trunk Assessment   Upper Extremity Assessment Upper Extremity Assessment: Overall WFL for tasks assessed    Lower Extremity Assessment Lower Extremity Assessment: Overall WFL for tasks assessed    Cervical / Trunk Assessment Cervical / Trunk Assessment: Normal  Communication   Communication: No difficulties  Cognition Arousal/Alertness: Awake/alert Behavior During Therapy: WFL for tasks assessed/performed Overall Cognitive Status: Within Functional Limits for tasks assessed                                        General Comments      Exercises Other Exercises Other Exercises: able to maintain static standing balance single leg bil at least 30 sec Other Exercises: static standing balance in tandem stance eyes closed no UE support 30 sec   Assessment/Plan    PT Assessment Patent does not need any further PT services  PT Problem List         PT Treatment Interventions      PT Goals (Current goals can be found in the Care Plan section)  Acute Rehab PT Goals Patient Stated Goal: be able to function independently PT Goal Formulation: With patient Time For Goal Achievement: 10/18/19 Potential to Achieve Goals: Good    Frequency     Barriers to discharge        Co-evaluation               AM-PAC PT "6 Clicks" Mobility  Outcome Measure Help needed turning from your back to your side while in a flat bed without using bedrails?: None Help needed moving from lying on your back to sitting on the side of a flat bed without using bedrails?: None Help needed moving to and from a bed to a chair (including a  wheelchair)?: None Help needed standing up from a chair using your arms (e.g., wheelchair or bedside chair)?: None Help needed to walk in hospital room?: None Help needed climbing 3-5 steps with a railing? : None 6 Click Score: 24    End of Session Equipment Utilized During Treatment: Gait belt Activity Tolerance: Patient tolerated treatment well Patient left: in chair;with family/visitor present;with nursing/sitter in room;with call bell/phone within reach Nurse Communication: Mobility status PT Visit Diagnosis: Other symptoms and signs involving the nervous system (R29.898)    Time: [id number] PT Time Calculation (min) (ACUTE ONLY): 40 min   Charges:   PT Evaluation $PT Eval Moderate Complexity: 1 Mod PT Treatments $Self Care/Home Management: 8-22       Zachary George PT, DPT 3:13 PM,10/04/19 [phone]   Sorayah Schrodt Drucilla Chalet 10/04/2019, 3:13 PM

## 2019-10-04 NOTE — ED Provider Notes (Signed)
[facility] Emergency Department Provider Note ____________________________________________   First MD Initiated Contact with Patient 10/04/19 0915     (approximate)  I have reviewed the triage vital signs and the nursing notes.   HISTORY  Chief Complaint Aphasia    HPI Jessica Hampton is a 49 y.o. female with PMH as noted below who presents with acute onset of difficulty speaking around 8:30 AM this morning.  Her husband also noted that she had an apparent left facial droop.  She denies any associated weakness or numbness in the extremities or any vision changes.  She does report a frontal headache over the last few days.  The patient states that she used to be on 2 blood pressure medications but was taken off of them a few years ago in preparation for a surgery, and never started them again.  She denies any prior history of stroke or any cardiac history.   Past Medical History:  Diagnosis Date  . Anemia   . Complication of anesthesia   . Family history of adverse reaction to anesthesia    MOM NAUSEATED  . GERD (gastroesophageal reflux disease)    OCC  . Hypertension   . PONV (postoperative nausea and vomiting)    NAUSEATED    Patient Active Problem List   Diagnosis Date Noted  . CVA (cerebral vascular accident) ([place]) 10/04/2019  . Hypertensive urgency 10/04/2019  . Acute CVA (cerebrovascular accident) ([place]) 10/04/2019  . Morbid obesity ([place]) 10/02/2016  . VIRAL INFECTION, ACUTE 09/02/2008  . HYPERTENSION, BENIGN ESSENTIAL 04/15/2007  . POLYCYSTIC OVARIAN DISEASE 04/14/2007    Past Surgical History:  Procedure Laterality Date  . LAPAROSCOPIC GASTRIC SLEEVE RESECTION WITH HIATAL HERNIA REPAIR N/A 10/02/2016   Procedure: LAPAROSCOPIC GASTRIC SLEEVE RESECTION WITH HIATAL HERNIA REPAIR;  Surgeon: Bonner Puna, MD;  Location: [facility] ORS;  Service: General;  Laterality: N/A;  . TUBAL LIGATION  2004    Prior to Admission medications   Medication Sig  Start Date End Date Taking? Authorizing Provider  buPROPion (WELLBUTRIN XL) 150 MG 24 hr tablet Take 150 mg by mouth daily. 08/09/19  Yes [provider]  FLUoxetine (PROZAC) 20 MG capsule Take 20 mg by mouth daily.    Yes [provider]  magnesium oxide (MAG-OX) 400 MG tablet Take 400 mg by mouth daily.   Yes [provider]  Multiple Vitamins-Minerals (BARIATRIC MULTIVITAMINS/IRON PO) Take 1 tablet by mouth daily.   Yes [provider]    Allergies Nsaids  Family History  Problem Relation Age of Onset  . Hypertension Mother   . Hyperlipidemia Father   . Hypertension Father     Social History Social History   Tobacco Use  . Smoking status: Never Smoker  . Smokeless tobacco: Never Used  Substance Use Topics  . Alcohol use: No  . Drug use: No    Review of Systems  Constitutional: No fever. Eyes: No visual changes. ENT: No sore throat. Cardiovascular: Denies chest pain. Respiratory: Denies shortness of breath. Gastrointestinal: No vomiting. Genitourinary: Negative for flank pain.  Musculoskeletal: Negative for back pain. Skin: Negative for rash. Neurological: Positive for headache, negative for focal weakness or numbness.   ____________________________________________   PHYSICAL EXAM:  VITAL SIGNS: ED Triage Vitals [10/04/19 0911]  Enc Vitals Group     BP (!) 178/107     Pulse Rate (!) 107     Resp (!) 24     Temp 98.6 F (37 C)  Temp Source Oral     SpO2 97 %     Weight 246 lb (111.6 kg)     Height 5\' 7"  (1.702 m)     Head Circumference      Peak Flow      Pain Score 0     Pain Loc      Pain Edu?      Excl. in [place]?     Constitutional: Alert and oriented.  Relatively well appearing and in no acute distress. Eyes: Conjunctivae are normal.  EOMI.  PERRLA. Head: Atraumatic. Nose: No congestion/rhinnorhea. Mouth/Throat: Mucous membranes are moist.   Neck: Normal range of motion.  Cardiovascular: Tachycardic,,  regular rhythm.  Good peripheral circulation. Respiratory: Normal respiratory effort.  No retractions.  Gastrointestinal: No distention.  Musculoskeletal: No lower extremity edema.  Extremities warm and well perfused.  Neurologic:  Normal speech with occasional difficulty finding a word.  Faint left facial droop. Skin:  Skin is warm and dry. No rash noted. Psychiatric: Mood and affect are normal. Speech and behavior are normal.  ____________________________________________   LABS (all labs ordered are listed, but only abnormal results are displayed)  Labs Reviewed  CBC - Abnormal; Notable for the following components:      Result Value   Hemoglobin 11.1 (*)    MCV 78.6 (*)    MCH 24.0 (*)    RDW 18.3 (*)    All other components within normal limits  COMPREHENSIVE METABOLIC PANEL - Abnormal; Notable for the following components:   Glucose, Bld 101 (*)    Calcium 8.8 (*)    All other components within normal limits  SARS CORONAVIRUS 2 (TAT 6-24 HRS)  PROTIME-INR  APTT  DIFFERENTIAL  GLUCOSE, CAPILLARY  HIV ANTIBODY (ROUTINE TESTING W REFLEX)  POC URINE PREG, ED  TROPONIN I (HIGH SENSITIVITY)  TROPONIN I (HIGH SENSITIVITY)   ____________________________________________  EKG  ED ECG REPORT I, Arta Silence, the attending physician, personally viewed and interpreted this ECG.  Date: 10/04/2019 EKG Time: 09 13 Rate: 104 Rhythm: Sinus tachycardia QRS Axis: normal Intervals: normal ST/T Wave abnormalities: normal Narrative Interpretation: no evidence of acute ischemia  ____________________________________________  RADIOLOGY  CT head: 3 small areas of right acute MCA infarction CT angio head/neck: Segmental high-grade stenosis of right M1 MCA  ____________________________________________   PROCEDURES  Procedure(s) performed: No  Procedures  Critical Care performed: Yes  CRITICAL CARE Performed by: Arta Silence   Total critical care time: 40  minutes  Critical care time was exclusive of separately billable procedures and treating other patients.  Critical care was necessary to treat or prevent imminent or life-threatening deterioration.  Critical care was time spent personally by me on the following activities: development of treatment plan with patient and/or surrogate as well as nursing, discussions with consultants, evaluation of patient's response to treatment, examination of patient, obtaining history from patient or surrogate, ordering and performing treatments and interventions, ordering and review of laboratory studies, ordering and review of radiographic studies, pulse oximetry and re-evaluation of patient's condition. ____________________________________________   INITIAL IMPRESSION / ASSESSMENT AND PLAN / ED COURSE  Pertinent labs & imaging results that were available during my care of the patient were reviewed by me and considered in my medical decision making (see chart for details).  49 year old female with a history of hypertension not currently on any medication presents with acute onset of aphasia around 830 this morning which seems to have mostly resolved as well as a left facial droop which she  first noted around the same time.  I reviewed the past medical records in [place].  The patient has had no recent ED visits or admissions, and was last admitted for gastric sleeve surgery in 2018.  On exam, the patient is hypertensive and slightly tachycardic.  She has a faint left facial droop.  She is mostly able to speak normally, but occasionally does have difficulty finding a word consistent with mild intermittent aphasia.  The remainder of the neurologic exam is nonfocal.  Code stroke was activated.  I discussed the case on the phone with Dr. Irish Elders from neurology who will come to see the patient.  CT is pending.  ----------------------------------------- 10:34 AM on  10/04/2019 -----------------------------------------  CT shows several small areas of infarct.  I discussed the findings with Dr. Irish Elders over the phone.  He is en route to evaluate the patient.  The patient has no significant aphasia at this time and still has a mild left facial droop.  The remainder of the neurologic exam is unchanged.  Her NIH stroke scale is 2.  Per Dr. Irish Elders, TPA is not indicated at this time.  ----------------------------------------- 3:17 PM on 10/04/2019 -----------------------------------------  Dr. Irish Elders evaluated the patient in the ED.  I discussed the case with the hospitalist Dr. Francine Graven for admission.  ____________________________________________   FINAL CLINICAL IMPRESSION(S) / ED DIAGNOSES  Final diagnoses:  Acute CVA (cerebrovascular accident) ([place])      NEW MEDICATIONS STARTED DURING THIS VISIT:  Current Discharge Medication List       Note:  This document was prepared using Dragon voice recognition software and may include unintentional dictation errors.    Arta Silence, MD 10/04/19 1517

## 2019-10-04 NOTE — ED Notes (Signed)
Continue to await arrival of neurology.  Dr. Cherylann Banas aware.

## 2019-10-04 NOTE — ED Notes (Signed)
PT states she is feeling better, remains HTN, continue to await arrival of neurology, Dr. Cherylann Banas aware of above.

## 2019-10-04 NOTE — ED Notes (Signed)
Pt returned to room from CT.  Awaiting neuro at this time.

## 2019-10-04 NOTE — Progress Notes (Signed)
OT Cancellation Note  Patient Details Name: CHARNIECE KWIAT MRN: [id number] DOB: 1970/12/07   Cancelled Treatment:    Reason Eval/Treat Not Completed: OT screened, no needs identified, will sign off Thank you for the OT consult. Chart reviewed. Per PT evaluation and conversation c pt and her husband, pt has no acute skilled OT needs at this time. Please re-consult if condition changes.  Dessie Coma, M.S. OTR/L  10/04/19, 3:06 PM

## 2019-10-04 NOTE — Progress Notes (Signed)
   10/04/19 [id number]  Clinical Encounter Type  Visited With Patient  Visit Type Initial  Referral From Nurse  Consult/Referral To Chaplain  Spiritual Encounters  Spiritual Needs Emotional  CH received medical alert code stroke for pt at 0931. Pt was laying flat on hospital bed upon arrival. Displayed no signs of pain. Pt was talkative and pleasant. [place] introduced self. Pt has strong emotional and spiritual support system in community. Pt shared that father is a Theme park manager and prayed for her before she was admitted to hospital. No further needs are expressed at this time.

## 2019-10-04 NOTE — ED Triage Notes (Signed)
Pt arrives via EMS from home after having difficulty speaking and a facial droop per her husband- pt continues to have slurred speech w/o facial droop- pt was taken off bp meds when she has gastric surgery- pt bp on scene was 230/138- EMS got 191/101 and a second reading of 172/102- pt has had a HA x3 days

## 2019-10-04 NOTE — ED Notes (Signed)
Neurology arrived to room at this time.

## 2019-10-04 NOTE — Plan of Care (Signed)
Patient arrived to floor in stable condition AOX4. Stroke scale score of zero. Stroke education handout provided.

## 2019-10-05 ENCOUNTER — Inpatient Hospital Stay (HOSPITAL_COMMUNITY)
Admit: 2019-10-05 | Discharge: 2019-10-05 | Disposition: A | Discharge status: Home / Self Care | Payer: Managed Care, Other (non HMO) | Attending: Internal Medicine | Admitting: Internal Medicine

## 2019-10-05 DIAGNOSIS — I63511 Cerebral infarction due to unspecified occlusion or stenosis of right middle cerebral artery: Secondary | ICD-10-CM

## 2019-10-05 DIAGNOSIS — I6389 Other cerebral infarction: Secondary | ICD-10-CM

## 2019-10-05 LAB — HIV ANTIBODY (ROUTINE TESTING W REFLEX): HIV Screen 4th Generation wRfx: NONREACTIVE

## 2019-10-05 LAB — HEMOGLOBIN A1C
Hgb A1c MFr Bld: 5.3 % (ref 4.8–5.6)
Mean Plasma Glucose: 105.41 mg/dL

## 2019-10-05 LAB — LIPID PANEL
Cholesterol: 197 mg/dL (ref 0–200)
HDL: 58 mg/dL (ref >40)
LDL Cholesterol: 95 mg/dL (ref 0–99)
Total CHOL/HDL Ratio: 3.4 RATIO
Triglycerides: 220 mg/dL — ABNORMAL HIGH (ref <150)
VLDL: 44 mg/dL — ABNORMAL HIGH (ref 0–40)

## 2019-10-05 NOTE — Progress Notes (Signed)
Subjective: Patient is back to baseline. MRI confirms strokes in R MCA territory   Past Medical History:  Diagnosis Date  . Anemia   . Complication of anesthesia   . Family history of adverse reaction to anesthesia    MOM NAUSEATED  . GERD (gastroesophageal reflux disease)    OCC  . Hypertension   . PONV (postoperative nausea and vomiting)    NAUSEATED    Past Surgical History:  Procedure Laterality Date  . LAPAROSCOPIC GASTRIC SLEEVE RESECTION WITH HIATAL HERNIA REPAIR N/A 10/02/2016   Procedure: LAPAROSCOPIC GASTRIC SLEEVE RESECTION WITH HIATAL HERNIA REPAIR;  Surgeon: Bonner Puna, MD;  Location: [facility] ORS;  Service: General;  Laterality: N/A;  . TUBAL LIGATION  2004    Family History  Problem Relation Age of Onset  . Hypertension Mother   . Hyperlipidemia Father   . Hypertension Father     Social History:  reports that she has never smoked. She has never used smokeless tobacco. She reports that she does not drink alcohol or use drugs.  Allergies  Allergen Reactions  . Nsaids Other (See Comments)    History of gastric bypass      Physical Examination: Blood pressure (!) 144/88, pulse 70, temperature 98.2 F (36.8 C), temperature source Oral, resp. rate 16, height 5\' 7"  (1.702 m), weight 111.6 kg, SpO2 96 %.    Neurological Examination   Mental Status: Alert, oriented, thought content appropriate.  Speech fluent without evidence of aphasia.  Able to follow 3 step commands without difficulty. Cranial Nerves: II: Discs flat bilaterally; Visual fields grossly normal, pupils equal, round, reactive to light and accommodation III,IV, VI: ptosis not present, extra-ocular motions intact bilaterally V,VII: smile symmetric, facial light touch sensation normal bilaterally VIII: hearing normal bilaterally IX,X: gag reflex present XI: bilateral shoulder shrug XII: midline tongue extension Motor: Right : Upper extremity   5/5    Left:     Upper extremity   5/5  Lower  extremity   5/5     Lower extremity   5/5 Tone and bulk:normal tone throughout; no atrophy noted Sensory: Pinprick and light touch intact throughout, bilaterally Deep Tendon Reflexes: 2+ and symmetric throughout Plantars: Right: downgoing   Left: downgoing Cerebellar: normal finger-to-nose, normal rapid alternating movements and normal heel-to-shin test Gait: not tested      Laboratory Studies:   Basic Metabolic Panel: Recent Labs  Lab 10/04/19 0917  NA 136  K 3.5  CL 102  CO2 24  GLUCOSE 101*  BUN 12  CREATININE 0.57  CALCIUM 8.8*    Liver Function Tests: Recent Labs  Lab 10/04/19 0917  AST 19  ALT 14  ALKPHOS 60  BILITOT 0.5  PROT 7.2  ALBUMIN 4.1   No results for input(s): LIPASE, AMYLASE in the last 168 hours. No results for input(s): AMMONIA in the last 168 hours.  CBC: Recent Labs  Lab 10/04/19 0917  WBC 8.1  NEUTROABS 5.1  HGB 11.1*  HCT 36.3  MCV 78.6*  PLT 219    Cardiac Enzymes: No results for input(s): CKTOTAL, CKMB, CKMBINDEX, TROPONINI in the last 168 hours.  BNP: Invalid input(s): POCBNP  CBG: Recent Labs  Lab 10/04/19 0920  GLUCAP 64    Microbiology: Results for orders placed or performed during the hospital encounter of 10/04/19  SARS CORONAVIRUS 2 (TAT 6-24 HRS) Nasopharyngeal Nasopharyngeal Swab     Status: None   Collection Time: 10/04/19 11:37 AM   Specimen: Nasopharyngeal Swab  Result Value Ref Range  Status   SARS Coronavirus 2 NEGATIVE NEGATIVE Final    Comment: (NOTE) SARS-CoV-2 target nucleic acids are NOT DETECTED. The SARS-CoV-2 RNA is generally detectable in upper and lower respiratory specimens during the acute phase of infection. Negative results do not preclude SARS-CoV-2 infection, do not rule out co-infections with other pathogens, and should not be used as the sole basis for treatment or other patient management decisions. Negative results must be combined with clinical observations, patient history,  and epidemiological information. The expected result is Negative. Fact Sheet for Patients: SugarRoll.be Fact Sheet for Healthcare Providers: https://www.woods-mathews.com/ This test is not yet approved or cleared by the [place] FDA and  has been authorized for detection and/or diagnosis of SARS-CoV-2 by FDA under an Emergency Use Authorization (EUA). This EUA will remain  in effect (meaning this test can be used) for the duration of the COVID-19 declaration under Section 56 4(b)(1) of the Act, 21 U.S.C. section 360bbb-3(b)(1), unless the authorization is terminated or revoked sooner. Performed at [facility], [place] [address], [place], [place] [address]     Coagulation Studies: Recent Labs    10/04/19 0917  LABPROT 12.2  INR 0.9    Urinalysis: No results for input(s): COLORURINE, LABSPEC, PHURINE, GLUCOSEU, HGBUR, BILIRUBINUR, KETONESUR, PROTEINUR, UROBILINOGEN, NITRITE, LEUKOCYTESUR in the last 168 hours.  Invalid input(s): APPERANCEUR  Lipid Panel:     Component Value Date/Time   CHOL 197 10/05/2019 0442   TRIG 220 (H) 10/05/2019 0442   HDL 58 10/05/2019 0442   CHOLHDL 3.4 10/05/2019 0442   VLDL 44 (H) 10/05/2019 0442   LDLCALC 95 10/05/2019 0442    HgbA1C:  Lab Results  Component Value Date   HGBA1C 5.3 10/05/2019    Urine Drug Screen:  No results found for: LABOPIA, COCAINSCRNUR, LABBENZ, AMPHETMU, THCU, LABBARB  Alcohol Level: No results for input(s): ETH in the last 168 hours.  Other results: EKG: normal EKG, normal sinus rhythm, unchanged from previous tracings.  Imaging: CT Angio Head W or Wo Contrast  Result Date: 10/04/2019 CLINICAL DATA:  Code stroke follow-up EXAM: CT ANGIOGRAPHY HEAD AND NECK TECHNIQUE: Multidetector CT imaging of the head and neck was performed using the standard protocol during bolus administration of intravenous contrast. Multiplanar CT image reconstructions and MIPs were  obtained to evaluate the vascular anatomy. Carotid stenosis measurements (when applicable) are obtained utilizing NASCET criteria, using the distal internal carotid diameter as the denominator. CONTRAST:  31mL OMNIPAQUE IOHEXOL 350 MG/ML SOLN COMPARISON:  None. FINDINGS: CTA NECK FINDINGS Aortic arch: Great vessel origins are patent. Right carotid system: Common, internal, and external carotid arteries are patent. There is no measurable stenosis or evidence of dissection. Left carotid system: Common, internal, and external carotid arteries are patent. There is no measurable stenosis or evidence of dissection. Vertebral arteries: Patent. No measurable stenosis or evidence of dissection. Skeleton: No significant abnormality. Other neck: No mass or adenopathy. Upper chest: No apical lung mass. Review of the MIP images confirms the above findings CTA HEAD FINDINGS Anterior circulation: Intracranial internal carotid arteries are patent with minor calcified plaque on the right. Anterior and middle cerebral arteries are patent. There is a 5 mm segment of high-grade stenosis of the right M1 MCA. Posterior circulation: Intracranial vertebral arteries, basilar artery, and posterior cerebral arteries are patent. Venous sinuses: As permitted by contrast timing, patent. Review of the MIP images confirms the above findings IMPRESSION: Suboptimal contrast bolus timing. No proximal intracranial vessel occlusion. 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 No hemodynamically significant stenosis in the neck. Electronically Signed   By: Macy Mis M.D.   On: 10/04/2019 10:08   CT Angio Neck W and/or Wo Contrast  Result Date: 10/04/2019 CLINICAL DATA:  Code stroke follow-up EXAM: CT ANGIOGRAPHY HEAD AND NECK TECHNIQUE: Multidetector CT imaging of the head and neck was performed using the standard protocol during bolus administration of intravenous contrast. Multiplanar CT image reconstructions and MIPs were obtained to  evaluate the vascular anatomy. Carotid stenosis measurements (when applicable) are obtained utilizing NASCET criteria, using the distal internal carotid diameter as the denominator. CONTRAST:  54mL OMNIPAQUE IOHEXOL 350 MG/ML SOLN COMPARISON:  None. FINDINGS: CTA NECK FINDINGS Aortic arch: Great vessel origins are patent. Right carotid system: Common, internal, and external carotid arteries are patent. There is no measurable stenosis or evidence of dissection. Left carotid system: Common, internal, and external carotid arteries are patent. There is no measurable stenosis or evidence of dissection. Vertebral arteries: Patent. No measurable stenosis or evidence of dissection. Skeleton: No significant abnormality. Other neck: No mass or adenopathy. Upper chest: No apical lung mass. Review of the MIP images confirms the above findings CTA HEAD FINDINGS Anterior circulation: Intracranial internal carotid arteries are patent with minor calcified plaque on the right. Anterior and middle cerebral arteries are patent. There is a 5 mm segment of high-grade stenosis of the right M1 MCA. Posterior circulation: Intracranial vertebral arteries, basilar artery, and posterior cerebral arteries are patent. Venous sinuses: As permitted by contrast timing, patent. Review of the MIP images confirms the above findings IMPRESSION: Suboptimal contrast bolus timing. No proximal intracranial vessel occlusion. 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 No hemodynamically significant stenosis in the neck. Electronically Signed   By: Macy Mis M.D.   On: 10/04/2019 10:08   MR BRAIN WO CONTRAST  Result Date: 10/04/2019 CLINICAL DATA:  Stroke, follow-up EXAM: MRI HEAD WITHOUT CONTRAST TECHNIQUE: Multiplanar, multiecho pulse sequences of the brain and surrounding structures were obtained without intravenous contrast. COMPARISON:  Correlation made with CT imaging earlier same day FINDINGS: Brain: There are multiple areas of reduced  diffusion within the right MCA territory as identified on the prior CT. This includes involvement of the caudate and adjacent corona radiata, right frontal lobe including the lateral precentral gyrus, and right temporal lobe. There is no evidence of intracranial hemorrhage. Minimal additional small foci of T2 hyperintensity in the supratentorial white matter are nonspecific but may reflect minor chronic microvascular ischemic changes or gliosis/demyelination of other etiologies. There is no intracranial mass or significant mass effect. There is no hydrocephalus or extra-axial fluid collection. Vascular: Major vessel flow voids at the skull base are preserved. Skull and upper cervical spine: Normal marrow signal is preserved. Sinuses/Orbits: Paranasal sinuses are aerated. Orbits are unremarkable. Other: Sella is unremarkable.  Mastoid air cells are clear. IMPRESSION: Several acute/late acute infarctions of the right MCA territory as seen on prior CT. Electronically Signed   By: Macy Mis M.D.   On: 10/04/2019 13:50   CT HEAD CODE STROKE WO CONTRAST  Result Date: 10/04/2019 CLINICAL DATA:  Code stroke.  Difficulty speaking, facial droop EXAM: CT HEAD WITHOUT CONTRAST TECHNIQUE: Contiguous axial images were obtained from the base of the skull through the vertex without intravenous contrast. COMPARISON:  None. FINDINGS: Brain: There is no acute intracranial hemorrhage. There are a few small areas of hypoattenuation in the right MCA territory including the right caudate, inferior right temporal gyrus, and lateral right frontal lobe probably involving the precentral  gyrus in the facial motor region. There is no acute intracranial hemorrhage. There is mild effacement of the right frontal horn. No hydrocephalus. No extra-axial fluid collection. Vascular: There is no hyperdense vessel. Skull: Unremarkable. Sinuses/Orbits: Aerated.  Unremarkable. Other: Mastoid air cells are clear. ASPECTS [facility] Stroke Program  Early CT Score) - Ganglionic level infarction (caudate, lentiform nuclei, internal capsule, insula, M1-M3 cortex): 4 - Supraganglionic infarction (M4-M6 cortex): 3 Total score (0-10 with 10 being normal): 7 IMPRESSION: No acute intracranial hemorrhage. Three small areas of acute infarction within the MCA territory. ASPECT score is 7. These results were called by telephone at the time of interpretation on 10/04/2019 at 9:37 am to provider [facility] , who verbally acknowledged these results. Electronically Signed   By: Macy Mis M.D.   On: 10/04/2019 09:45     Assessment/Plan:  49 y.o. female present female with hx of gastric sleeve, HTN. Ps states she woke up this AM and walked her dogs. At about 8:30 AM patient had difficulty getting words out and was noted by her husband. Patient was complaining of diffuse pressure like headache.   No improved and close to baseline. NIHSS of 0 Headache 2/10 currently. No anti platelet therapy prior.    - CTH with possible subcortical ischemia on R side (MCA distribution) and confirmed on MRI  - Not tpa candidate given the resolution or near resolution of symptoms along with CTH findings - CTA with R M1 stenosis  - ASA 81mg  daily and Plavix 75 daily for that high grade R M1 stenosis for at least 90 days  - BP control  - likely d/c planning  10/05/2019, 11:20 AM

## 2019-10-05 NOTE — Progress Notes (Signed)
SLP Cancellation Note  Patient Details Name: CYRENA KUCHENBECKER MRN: [id number] DOB: 1971/07/15   Cancelled treatment:       Reason Eval/Treat Not Completed: SLP screened, no needs identified, will sign off(chart reviewed; consulted NSG then met w/ pt/spouse). Pt denied any difficulty swallowing and is currently on a regular diet; tolerates swallowing pills w/ water per NSG. Pt conversed at conversational level w/out deficits noted; pt and Husband denied any speech-language deficits now. Gave education on general strategies to support language healing post stroke to include rest, hydration, reducing stress during conversation.  No further skilled ST services indicated as pt appears at her baseline. Recommend pt f/u w/ PCP if any new concerns re: her language post d/c home. Pt agreed. NSG to reconsult if any change in status while admitted.      Orinda Kenner, MS, CCC-SLP Josedejesus Marcum 10/05/2019, 10:19 AM

## 2019-10-05 NOTE — Progress Notes (Signed)
   10/05/19 1900  Clinical Encounter Type  Visited With Patient and family together  Visit Type Follow-up  Referral From Chaplain  Consult/Referral To Chaplain  Spiritual Encounters  Spiritual Needs Prayer;Emotional  This was a routine visit;  Chaplain visited with patient and husband. Patient and husband was grateful for the visit. Patient stated that she was doing much better and was ready to go home. Both have a deep spiritual background and are devout Christians. There was good conversation. Chaplain offered prayer. This concluded the visit.   End of visit No Further

## 2019-10-05 NOTE — Progress Notes (Signed)
PROGRESS NOTE    Jessica Hampton  [id number]  DOB: April 10, 1971  DOA: 10/04/2019 PCP: Modesto Charon, MD Outpatient Specialists:   Hospital course:  49 year old female with history of hypertension resolved status post gastric sleeve was admitted yesterday with stroke which presented as nonfluent aphasia, now presently entirely resolved.  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  MRI shows several "acute to subacute strokes in the MCA distribution".  Patient was seen by neurology who started her on aspirin and Plavix.  Subjective:  Patient states that she feels almost back to baseline.  She is having a little bit of difficulty finding all her words however feels no real difficulty in expressing herself.  She is very relieved.   Objective: Vitals:   10/04/19 1712 10/05/19 0102 10/05/19 0807 10/05/19 1329  BP: (!) 151/90 (!) 155/93 (!) 144/88 (!) 157/104  Pulse: 77 74 70 83  Resp:    16  Temp:  98 F (36.7 C) 98.2 F (36.8 C) 98.4 F (36.9 C)  TempSrc:  Oral Oral Oral  SpO2:  99% 96% 98%  Weight:      Height:        Intake/Output Summary (Last 24 hours) at 10/05/2019 1515 Last data filed at 10/05/2019 1347 Gross per 24 hour  Intake 480 ml  Output --  Net 480 ml   Filed Weights   10/04/19 0911  Weight: 111.6 kg     Assessment & Plan:   49 year old female presented with nonfluent aphasia yesterday and is found to have stroke in MCA distribution as well as stenosis at M1.  Patient is on aspirin and Plavix  CVA Symptoms have almost entirely resolved. She was started on aspirin and Plavix and atorvastatin per neurology. Echocardiogram is still pending. Patient has been seen by PT OT and speech and they note no further acute recommendations at present.  HTN Patient states she was on 2 antihypertensives before her gastric sleeve, she has lost 80 pounds but is gained back 15.  She has minimal hypertension at present, some of this is post stroke and will need permissive  hypertension.  Patient's blood pressure will need to be followed very closely by her PCP as an outpatient.   DVT prophylaxis: SCD Code Status: Full Family Communication: Patient's husband was at bedside during neurology evaluation, she said no need to call him further. Disposition Plan:   Patient is from: Home  Anticipated Discharge Location: Home  Barriers to Discharge: Awaiting echocardiogram  Is patient medically stable for Discharge: Yes   Consultants:  Neurology  Procedures:  None  Antimicrobials:  None   Exam:  General: Obese female lying flat in bed in no acute distress.  She is able to speak in full sentences and use complex multisyllabic words without difficulty. Eyes: sclera anicteric, conjuctiva mild injection bilaterally CVS: S1-S2, regular  Respiratory:  decreased air entry bilaterally secondary to decreased inspiratory effort, rales at bases  GI: NABS, soft, NT  LE: No edema.  Neuro: A/O x 3, Moving all extremities equally with normal strength, CN 3-12 intact, comprehension is intact.  As noted above patient is able to speak fluently and with complex words without difficulty.  No dysarthria. Psych: patient is logical and coherent, judgement and insight appear normal, mood and affect appropriate to situation.   Data Reviewed: Basic Metabolic Panel: Recent Labs  Lab 10/04/19 0917  NA 136  K 3.5  CL 102  CO2 24  GLUCOSE 101*  BUN 12  CREATININE 0.57  CALCIUM 8.8*   Liver Function Tests: Recent Labs  Lab 10/04/19 0917  AST 19  ALT 14  ALKPHOS 60  BILITOT 0.5  PROT 7.2  ALBUMIN 4.1   No results for input(s): LIPASE, AMYLASE in the last 168 hours. No results for input(s): AMMONIA in the last 168 hours. CBC: Recent Labs  Lab 10/04/19 0917  WBC 8.1  NEUTROABS 5.1  HGB 11.1*  HCT 36.3  MCV 78.6*  PLT 219   Cardiac Enzymes: No results for input(s): CKTOTAL, CKMB, CKMBINDEX, TROPONINI in the last 168 hours. BNP (last 3 results) No  results for input(s): PROBNP in the last 8760 hours. CBG: Recent Labs  Lab 10/04/19 0920  GLUCAP 89    Recent Results (from the past 240 hour(s))  SARS CORONAVIRUS 2 (TAT 6-24 HRS) Nasopharyngeal Nasopharyngeal Swab     Status: None   Collection Time: 10/04/19 11:37 AM   Specimen: Nasopharyngeal Swab  Result Value Ref Range Status   SARS Coronavirus 2 NEGATIVE NEGATIVE Final    Comment: (NOTE) SARS-CoV-2 target nucleic acids are NOT DETECTED. The SARS-CoV-2 RNA is generally detectable in upper and lower respiratory specimens during the acute phase of infection. Negative results do not preclude SARS-CoV-2 infection, do not rule out co-infections with other pathogens, and should not be used as the sole basis for treatment or other patient management decisions. Negative results must be combined with clinical observations, patient history, and epidemiological information. The expected result is Negative. Fact Sheet for Patients: SugarRoll.be Fact Sheet for Healthcare Providers: https://www.woods-mathews.com/ This test is not yet approved or cleared by the [place] FDA and  has been authorized for detection and/or diagnosis of SARS-CoV-2 by FDA under an Emergency Use Authorization (EUA). This EUA will remain  in effect (meaning this test can be used) for the duration of the COVID-19 declaration under Section 56 4(b)(1) of the Act, 21 U.S.C. section 360bbb-3(b)(1), unless the authorization is terminated or revoked sooner. Performed at [facility], [place] [address], [place], [place] [address]       Studies: CT Angio Head W or Wo Contrast  Result Date: 10/04/2019 CLINICAL DATA:  Code stroke follow-up EXAM: CT ANGIOGRAPHY HEAD AND NECK TECHNIQUE: Multidetector CT imaging of the head and neck was performed using the standard protocol during bolus administration of intravenous contrast. Multiplanar CT image reconstructions and MIPs  were obtained to evaluate the vascular anatomy. Carotid stenosis measurements (when applicable) are obtained utilizing NASCET criteria, using the distal internal carotid diameter as the denominator. CONTRAST:  21mL OMNIPAQUE IOHEXOL 350 MG/ML SOLN COMPARISON:  None. FINDINGS: CTA NECK FINDINGS Aortic arch: Great vessel origins are patent. Right carotid system: Common, internal, and external carotid arteries are patent. There is no measurable stenosis or evidence of dissection. Left carotid system: Common, internal, and external carotid arteries are patent. There is no measurable stenosis or evidence of dissection. Vertebral arteries: Patent. No measurable stenosis or evidence of dissection. Skeleton: No significant abnormality. Other neck: No mass or adenopathy. Upper chest: No apical lung mass. Review of the MIP images confirms the above findings CTA HEAD FINDINGS Anterior circulation: Intracranial internal carotid arteries are patent with minor calcified plaque on the right. Anterior and middle cerebral arteries are patent. There is a 5 mm segment of high-grade stenosis of the right M1 MCA. Posterior circulation: Intracranial vertebral arteries, basilar artery, and posterior cerebral arteries are patent. Venous sinuses: As permitted by contrast timing, patent. Review of the MIP images confirms the above findings IMPRESSION: Suboptimal contrast  bolus timing. No proximal intracranial vessel occlusion. 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 No hemodynamically significant stenosis in the neck. Electronically Signed   By: Macy Mis M.D.   On: 10/04/2019 10:08   CT Angio Neck W and/or Wo Contrast  Result Date: 10/04/2019 CLINICAL DATA:  Code stroke follow-up EXAM: CT ANGIOGRAPHY HEAD AND NECK TECHNIQUE: Multidetector CT imaging of the head and neck was performed using the standard protocol during bolus administration of intravenous contrast. Multiplanar CT image reconstructions and MIPs were obtained  to evaluate the vascular anatomy. Carotid stenosis measurements (when applicable) are obtained utilizing NASCET criteria, using the distal internal carotid diameter as the denominator. CONTRAST:  19mL OMNIPAQUE IOHEXOL 350 MG/ML SOLN COMPARISON:  None. FINDINGS: CTA NECK FINDINGS Aortic arch: Great vessel origins are patent. Right carotid system: Common, internal, and external carotid arteries are patent. There is no measurable stenosis or evidence of dissection. Left carotid system: Common, internal, and external carotid arteries are patent. There is no measurable stenosis or evidence of dissection. Vertebral arteries: Patent. No measurable stenosis or evidence of dissection. Skeleton: No significant abnormality. Other neck: No mass or adenopathy. Upper chest: No apical lung mass. Review of the MIP images confirms the above findings CTA HEAD FINDINGS Anterior circulation: Intracranial internal carotid arteries are patent with minor calcified plaque on the right. Anterior and middle cerebral arteries are patent. There is a 5 mm segment of high-grade stenosis of the right M1 MCA. Posterior circulation: Intracranial vertebral arteries, basilar artery, and posterior cerebral arteries are patent. Venous sinuses: As permitted by contrast timing, patent. Review of the MIP images confirms the above findings IMPRESSION: Suboptimal contrast bolus timing. No proximal intracranial vessel occlusion. 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 No hemodynamically significant stenosis in the neck. Electronically Signed   By: Macy Mis M.D.   On: 10/04/2019 10:08   MR BRAIN WO CONTRAST  Result Date: 10/04/2019 CLINICAL DATA:  Stroke, follow-up EXAM: MRI HEAD WITHOUT CONTRAST TECHNIQUE: Multiplanar, multiecho pulse sequences of the brain and surrounding structures were obtained without intravenous contrast. COMPARISON:  Correlation made with CT imaging earlier same day FINDINGS: Brain: There are multiple areas of  reduced diffusion within the right MCA territory as identified on the prior CT. This includes involvement of the caudate and adjacent corona radiata, right frontal lobe including the lateral precentral gyrus, and right temporal lobe. There is no evidence of intracranial hemorrhage. Minimal additional small foci of T2 hyperintensity in the supratentorial white matter are nonspecific but may reflect minor chronic microvascular ischemic changes or gliosis/demyelination of other etiologies. There is no intracranial mass or significant mass effect. There is no hydrocephalus or extra-axial fluid collection. Vascular: Major vessel flow voids at the skull base are preserved. Skull and upper cervical spine: Normal marrow signal is preserved. Sinuses/Orbits: Paranasal sinuses are aerated. Orbits are unremarkable. Other: Sella is unremarkable.  Mastoid air cells are clear. IMPRESSION: Several acute/late acute infarctions of the right MCA territory as seen on prior CT. Electronically Signed   By: Macy Mis M.D.   On: 10/04/2019 13:50   CT HEAD CODE STROKE WO CONTRAST  Result Date: 10/04/2019 CLINICAL DATA:  Code stroke.  Difficulty speaking, facial droop EXAM: CT HEAD WITHOUT CONTRAST TECHNIQUE: Contiguous axial images were obtained from the base of the skull through the vertex without intravenous contrast. COMPARISON:  None. FINDINGS: Brain: There is no acute intracranial hemorrhage. There are a few small areas of hypoattenuation in the right MCA territory including the right  caudate, inferior right temporal gyrus, and lateral right frontal lobe probably involving the precentral gyrus in the facial motor region. There is no acute intracranial hemorrhage. There is mild effacement of the right frontal horn. No hydrocephalus. No extra-axial fluid collection. Vascular: There is no hyperdense vessel. Skull: Unremarkable. Sinuses/Orbits: Aerated.  Unremarkable. Other: Mastoid air cells are clear. ASPECTS [facility] Stroke  Program Early CT Score) - Ganglionic level infarction (caudate, lentiform nuclei, internal capsule, insula, M1-M3 cortex): 4 - Supraganglionic infarction (M4-M6 cortex): 3 Total score (0-10 with 10 being normal): 7 IMPRESSION: No acute intracranial hemorrhage. Three small areas of acute infarction within the MCA territory. ASPECT score is 7. These results were called by telephone at the time of interpretation on 10/04/2019 at 9:37 am to provider [facility] , who verbally acknowledged these results. Electronically Signed   By: Macy Mis M.D.   On: 10/04/2019 09:45     Scheduled Meds: . aspirin EC  81 mg Oral Daily  . atorvastatin  80 mg Oral q1800  . buPROPion  150 mg Oral Daily  . clopidogrel  75 mg Oral Daily  . FLUoxetine  20 mg Oral Daily  . magnesium oxide  400 mg Oral Daily  . multivitamin with minerals  1 tablet Oral Daily   Continuous Infusions: . sodium chloride      Principal Problem:   CVA (cerebral vascular accident) ([place]) Active Problems:   Morbid obesity ([place])   Hypertensive urgency   Acute CVA (cerebrovascular accident) ([place])     Nyeshia Mysliwiec Derek Jack, [facility]  If 7PM-7AM, please contact night-coverage [email or web address] Password [email or web address] 10/05/2019, 3:15 PM    LOS: 1 day

## 2019-10-05 NOTE — Progress Notes (Signed)
*  PRELIMINARY RESULTS* Echocardiogram 2D Echocardiogram has been performed.  Jonette Mate Rayson Rando 10/05/2019, 8:01 PM

## 2019-10-06 DIAGNOSIS — I639 Cerebral infarction, unspecified: Secondary | ICD-10-CM

## 2019-10-06 LAB — ECHOCARDIOGRAM COMPLETE
Height: 67 in
Weight: 3936 oz

## 2019-10-06 MED ORDER — ASPIRIN 81 MG PO TBEC: 81.0000 mg | DELAYED_RELEASE_TABLET | Freq: Every day | ORAL | 0 refills | Status: DC

## 2019-10-06 MED ORDER — CLOPIDOGREL BISULFATE 75 MG PO TABS: 75.0000 mg | ORAL_TABLET | Freq: Every day | ORAL | 0 refills | Status: DC

## 2019-10-06 NOTE — Plan of Care (Signed)
  Problem: Education: Goal: Knowledge of secondary prevention will improve Outcome: Progressing Goal: Knowledge of patient specific risk factors addressed and post discharge goals established will improve Outcome: Progressing   

## 2019-10-06 NOTE — Progress Notes (Signed)
Pt  For discharge home a/o. No resp distress.   Denies pain. Instructions discussed with pt  Re meds diet activity and f/u.verbalizes understanding  Sl d/cd.  Out at this time via w/c  To home with husband. W/o c/o.

## 2019-10-06 NOTE — Discharge Instructions (Signed)
Eating Plan After Stroke A stroke causes damage to the brain cells, which can affect your ability to walk, talk, and even eat. The impact of a stroke is different for everyone, and so is recovery. A good nutrition plan is important for your recovery. It can also lower your risk of another stroke. If you have difficulty chewing and swallowing your food, a dietitian or your stroke care team can help so that you can enjoy eating healthy foods. What are tips for following this plan?  Reading food labels  Choose foods that have less than 300 milligrams (mg) of sodium per serving. Limit your sodium intake to less than 1,500 mg per day.  Avoid foods that have saturated fat and trans fat.  Choose foods that are low in cholesterol. Limit the amount of cholesterol you eat each day to less than 200 mg.  Choose foods that are high in fiber. Eat 20-30 grams (g) of fiber each day.  Avoid foods with added sugar. Check the food label for ingredients such as sugar, corn syrup, honey, fructose, molasses, and cane juice. Shopping  At the grocery store, buy most of your food from areas near the walls of the store. This includes: ? Fresh fruits and vegetables. ? Dry grains, beans, nuts, and seeds. ? Fresh seafood, poultry, lean meats, and eggs. ? Low-fat dairy products.  Buy whole ingredients instead of prepackaged foods.  Buy fresh, in-season fruits and vegetables from local farmers markets.  Buy frozen fruits and vegetables in resealable bags. Cooking  Prepare foods with very little salt. Use herbs or salt-free spices instead.  Cook with heart-healthy oils, such as olive, avocado, canola, soybean, or sunflower oil.  Avoid frying foods. Bake, grill, or broil foods instead.  Remove visible fat and skin from meat and poultry before eating.  Modify food textures as told by your health care provider. Meal planning  Eat a wide variety of colorful fruits and vegetables. Make sure one-half of your  plate is filled with fruits and vegetables at each meal.  Eat fruits and vegetables that are high in potassium, such as: ? Apples, bananas, oranges, and melon. ? Sweet potatoes, spinach, zucchini, and tomatoes.  Eat fish that contain heart-healthy fats (omega-3 fats) at least twice a week. These include salmon, tuna, mackerel, and sardines.  Eat plant foods that are high in omega-3 fats, such as flaxseeds and walnuts. Add these to cereals, yogurt, or pasta dishes.  Eat several servings of high-fiber foods each day, such as fruits, vegetables, whole grains, and beans.  Do not put salt at the table for meals.  When eating out at restaurants: ? Ask the server about low-salt or salt-free food options. ? Avoid fried foods. Look for menu items that are grilled, steamed, broiled, or roasted. ? Ask if your food can be prepared without butter. ? Ask for condiments, such as salad dressings, gravy, or sauces to be served on the side.  If you have difficulty swallowing: ? Choose foods that are softer and easier to chew and swallow. ? Cut foods into small pieces and chew well before swallowing. ? Thicken liquids as told by your health care provider or dietitian. ? Let your health care provider know if your condition does not improve over time. You may need to work with a speech therapist to re-train the muscles that are used for eating. General recommendations  Involve your family and friends in your recovery, if possible. It may be helpful to have a slower meal time   and to plan meals that include foods everyone in the family can eat.  Brush your teeth with fluoride toothpaste twice a day, and floss once a day. Keeping a clean mouth can help you swallow and can also help your appetite.  Drink enough water each day to keep your urine pale yellow. If needed, set reminders or ask your family to help you remember to drink water.  Limit alcohol intake to no more than 1 drink a day for nonpregnant  women and 2 drinks a day for men. One drink equals 12 oz of beer, 5 oz of wine, or 1 oz of hard liquor. Summary  Following this eating plan can help in your stroke recovery and can decrease your risk for another stroke.  Let your health care provider know if you have problems with swallowing. You may need to work with a speech therapist. This information is not intended to replace advice given to you by your health care provider. Make sure you discuss any questions you have with your health care provider. Document Revised: 11/06/2018 Document Reviewed: 09/23/2017 Elsevier Patient Education  2020 [employer]  

## 2019-10-06 NOTE — Discharge Summary (Signed)
SUNDI KUEHL [id number] DOB: September 03, 1970 DOA: 10/04/2019  PCP: Modesto Charon, MD  Admit date: 10/04/2019  Discharge date: 10/06/2019  Admitted From: Home   disposition: Home   Recommendations for Outpatient Follow-up:   Follow up with PCP in 1-2 weeks  Home Health: N/A Equipment/Devices: N/A Consultations: Neurology Discharge Condition: Improved CODE STATUS: Full Diet Recommendation: Heart Healthy   Diet Order            Diet - low sodium heart healthy        Diet 2 gram sodium Room service appropriate? Yes; Fluid consistency: Thin  Diet effective now               Chief Complaint  Patient presents with  . Aphasia     Brief history of present illness from the day of admission and additional interim summary    Kamela Stones Draxler is a 49 y.o. female with medical history significant for hypertension and morbid obesity status post gastric sleeve in 2018 who was brought into the emergency room by EMS after she developed sudden onset  difficulty speaking started around 8:30 AM.  Her husband also noted a left facial droop.  She had no lower extremity weakness or numbness, denies any vision changes or difficulty swallowing but has had a frontal headache for last couple of days.  She rated her headache about a 2/10 in intensity.  By the time she came to the emergency room patient symptoms had improved however she was still having some word finding difficulties.  Code stroke was activated and CT scan of the head without contrast showed 3 small areas of acute infarction involving the MCA territory.   By the time she completed her CT scan however her symptoms had completely resolved. She had a CT angiogram which showed 5 mm segment of high-grade stenosis of the right M1 MCA.  She did not receive TPA because her  symptoms had resolved.  Of note patient states she used to be hypertensive however lost 80 pounds with a gastric sleeve and BP meds had been discontinued.                                                                  Hospital Course   Patient was started on aspirin and Plavix.  She underwent evaluation by PT, OT and speech therapy.  She was felt to have returned to baseline and no further therapy was warranted.  Echocardiogram was done which was negative for any source of emboli.  She was seen by neurology who noted she had a pretty tight stenosis and recommended both aspirin and Plavix.  Patient had been concerned about started aspirin given her gastric sleeve however given how tight her stenosis was it was felt to be important that she take both.  She is to discuss that she has been started on aspirin and Plavix with her PCP or bariatric surgeon.  CVA Symptoms have almost entirely resolved. She was started on aspirin and Plavix and atorvastatin per neurology. Echocardiogram is within normal limits.. Patient has been seen by PT OT and speech and they note no further acute recommendations at present.  HTN Patient states she was on 2 antihypertensives before her gastric sleeve, she has lost 80 pounds but is gained back 15.  She has minimal hypertension at present, some of this is post stroke and will need permissive hypertension.  Patient's blood pressure will need to be followed very closely by her PCP as an outpatient.   Discharge diagnosis     Principal Problem:   CVA (cerebral vascular accident) [facility]) Active Problems:   Morbid obesity ([place])   Hypertensive urgency   Acute CVA (cerebrovascular accident) [facility])    Discharge instructions    Discharge Instructions    Diet - low sodium heart healthy   Complete by: As directed    Discharge instructions   Complete by: As directed    1.  Please let your bariatric physician know that you have been started on aspirin and Plavix so  they can monitor you. 2.  Please make an appointment to follow-up with [facility] neurology in 1 to 2 weeks.   Increase activity slowly   Complete by: As directed       Discharge Medications   Allergies as of 10/06/2019      Reactions   Nsaids Other (See Comments)   History of gastric bypass       Medication List    TAKE these medications   aspirin 81 MG EC tablet Take 1 tablet (81 mg total) by mouth daily. Start taking on: October 07, 2019   BARIATRIC MULTIVITAMINS/IRON PO Take 1 tablet by mouth daily.   buPROPion 150 MG 24 hr tablet Commonly known as: WELLBUTRIN XL Take 150 mg by mouth daily.   clopidogrel 75 MG tablet Commonly known as: PLAVIX Take 1 tablet (75 mg total) by mouth daily. Start taking on: October 07, 2019   FLUoxetine 20 MG capsule Commonly known as: PROZAC Take 20 mg by mouth daily.   magnesium oxide 400 MG tablet Commonly known as: MAG-OX Take 400 mg by mouth daily.       Follow-up Information    Anabel Bene, MD Follow up in 1 week(s).   Specialty: Neurology Contact information: [facility] [place] [address] [phone]           Major procedures and Radiology Reports - PLEASE review detailed and final reports thoroughly  -        CT Angio Head W or Wo Contrast  Result Date: 10/04/2019 CLINICAL DATA:  Code stroke follow-up EXAM: CT ANGIOGRAPHY HEAD AND NECK TECHNIQUE: Multidetector CT imaging of the head and neck was performed using the standard protocol during bolus administration of intravenous contrast. Multiplanar CT image reconstructions and MIPs were obtained to evaluate the vascular anatomy. Carotid stenosis measurements (when applicable) are obtained utilizing NASCET criteria, using the distal internal carotid diameter as the denominator. CONTRAST:  80mL OMNIPAQUE IOHEXOL 350 MG/ML SOLN COMPARISON:  None. FINDINGS: CTA NECK FINDINGS Aortic arch: Great vessel origins are  patent. Right carotid system: Common, internal, and external carotid arteries are patent. There is no measurable stenosis or evidence of dissection. Left carotid system: Common, internal, and external carotid arteries are patent. There is  no measurable stenosis or evidence of dissection. Vertebral arteries: Patent. No measurable stenosis or evidence of dissection. Skeleton: No significant abnormality. Other neck: No mass or adenopathy. Upper chest: No apical lung mass. Review of the MIP images confirms the above findings CTA HEAD FINDINGS Anterior circulation: Intracranial internal carotid arteries are patent with minor calcified plaque on the right. Anterior and middle cerebral arteries are patent. There is a 5 mm segment of high-grade stenosis of the right M1 MCA. Posterior circulation: Intracranial vertebral arteries, basilar artery, and posterior cerebral arteries are patent. Venous sinuses: As permitted by contrast timing, patent. Review of the MIP images confirms the above findings IMPRESSION: Suboptimal contrast bolus timing. No proximal intracranial vessel occlusion. 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 No hemodynamically significant stenosis in the neck. Electronically Signed   By: Macy Mis M.D.   On: 10/04/2019 10:08   CT Angio Neck W and/or Wo Contrast  Result Date: 10/04/2019 CLINICAL DATA:  Code stroke follow-up EXAM: CT ANGIOGRAPHY HEAD AND NECK TECHNIQUE: Multidetector CT imaging of the head and neck was performed using the standard protocol during bolus administration of intravenous contrast. Multiplanar CT image reconstructions and MIPs were obtained to evaluate the vascular anatomy. Carotid stenosis measurements (when applicable) are obtained utilizing NASCET criteria, using the distal internal carotid diameter as the denominator. CONTRAST:  66mL OMNIPAQUE IOHEXOL 350 MG/ML SOLN COMPARISON:  None. FINDINGS: CTA NECK FINDINGS Aortic arch: Great vessel origins are patent. Right  carotid system: Common, internal, and external carotid arteries are patent. There is no measurable stenosis or evidence of dissection. Left carotid system: Common, internal, and external carotid arteries are patent. There is no measurable stenosis or evidence of dissection. Vertebral arteries: Patent. No measurable stenosis or evidence of dissection. Skeleton: No significant abnormality. Other neck: No mass or adenopathy. Upper chest: No apical lung mass. Review of the MIP images confirms the above findings CTA HEAD FINDINGS Anterior circulation: Intracranial internal carotid arteries are patent with minor calcified plaque on the right. Anterior and middle cerebral arteries are patent. There is a 5 mm segment of high-grade stenosis of the right M1 MCA. Posterior circulation: Intracranial vertebral arteries, basilar artery, and posterior cerebral arteries are patent. Venous sinuses: As permitted by contrast timing, patent. Review of the MIP images confirms the above findings IMPRESSION: Suboptimal contrast bolus timing. No proximal intracranial vessel occlusion. 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 No hemodynamically significant stenosis in the neck. Electronically Signed   By: Macy Mis M.D.   On: 10/04/2019 10:08   MR BRAIN WO CONTRAST  Result Date: 10/04/2019 CLINICAL DATA:  Stroke, follow-up EXAM: MRI HEAD WITHOUT CONTRAST TECHNIQUE: Multiplanar, multiecho pulse sequences of the brain and surrounding structures were obtained without intravenous contrast. COMPARISON:  Correlation made with CT imaging earlier same day FINDINGS: Brain: There are multiple areas of reduced diffusion within the right MCA territory as identified on the prior CT. This includes involvement of the caudate and adjacent corona radiata, right frontal lobe including the lateral precentral gyrus, and right temporal lobe. There is no evidence of intracranial hemorrhage. Minimal additional small foci of T2 hyperintensity in  the supratentorial white matter are nonspecific but may reflect minor chronic microvascular ischemic changes or gliosis/demyelination of other etiologies. There is no intracranial mass or significant mass effect. There is no hydrocephalus or extra-axial fluid collection. Vascular: Major vessel flow voids at the skull base are preserved. Skull and upper cervical spine: Normal marrow signal is preserved. Sinuses/Orbits: Paranasal sinuses are aerated.  Orbits are unremarkable. Other: Sella is unremarkable.  Mastoid air cells are clear. IMPRESSION: Several acute/late acute infarctions of the right MCA territory as seen on prior CT. Electronically Signed   By: Macy Mis M.D.   On: 10/04/2019 13:50   ECHOCARDIOGRAM COMPLETE  Result Date: 10/06/2019    ECHOCARDIOGRAM REPORT   Patient Name:   MARGRIT WIGLEY Date of Exam: 10/05/2019 Medical Rec #:  [id number]       Height:       67.0 in Accession #:    [id number]      Weight:       246.0 lb Date of Birth:  February 24, 1971       BSA:          2.208 m Patient Age:    72 years        BP:           144/90 mmHg Patient Gender: F               HR:           78 bpm. Exam Location:  [facility] Procedure: 2D Echo, Cardiac Doppler and Color Doppler Indications:     Stroke 434.91  History:         Patient has no prior history of Echocardiogram examinations.                  Risk Factors:Hypertension.  Sonographer:     Alyse Low Roar Referring Phys:  [id number] AGBATA Diagnosing Phys: Nelva Bush MD  Sonographer Comments: Technically difficult study due to poor echo windows. IMPRESSIONS  1. Left ventricular ejection fraction, by estimation, is 60 to 65%. The left ventricle has normal function. Left ventricular endocardial border not optimally defined to evaluate regional wall motion. There is mild left ventricular hypertrophy. Left ventricular diastolic parameters are consistent with Grade I diastolic dysfunction (impaired relaxation).  2. Right ventricular systolic function is normal.  The right ventricular size is normal. Tricuspid regurgitation signal is inadequate for assessing PA pressure.  3. The mitral valve was not well visualized. Trivial mitral valve regurgitation.  4. The aortic valve was not well visualized. Aortic valve regurgitation is not visualized. No aortic stenosis is present. FINDINGS  Left Ventricle: Left ventricular ejection fraction, by estimation, is 60 to 65%. The left ventricle has normal function. Left ventricular endocardial border not optimally defined to evaluate regional wall motion. The left ventricular internal cavity size was normal in size. There is mild left ventricular hypertrophy. Left ventricular diastolic parameters are consistent with Grade I diastolic dysfunction (impaired relaxation). Right Ventricle: The right ventricular size is normal. No increase in right ventricular wall thickness. Right ventricular systolic function is normal. Tricuspid regurgitation signal is inadequate for assessing PA pressure. Left Atrium: Left atrial size was normal in size. Right Atrium: Right atrial size was normal in size. Pericardium: The pericardium was not well visualized. Mitral Valve: The mitral valve was not well visualized. Trivial mitral valve regurgitation. Tricuspid Valve: The tricuspid valve is not well visualized. Tricuspid valve regurgitation is not demonstrated. Aortic Valve: The aortic valve was not well visualized. Aortic valve regurgitation is not visualized. No aortic stenosis is present. Aortic valve mean gradient measures 4.0 mmHg. Aortic valve peak gradient measures 7.7 mmHg. Aortic valve area, by VTI measures 2.86 cm. Pulmonic Valve: The pulmonic valve was not well visualized. Pulmonic valve regurgitation is mild. No evidence of pulmonic stenosis. Aorta: The aortic root is normal in size and structure. Pulmonary Artery: The pulmonary  artery is not well seen. Venous: The inferior vena cava was not well visualized. IAS/Shunts: The interatrial septum was  not well visualized.  LEFT VENTRICLE PLAX 2D LVIDd:         4.09 cm  Diastology LVIDs:         3.11 cm  LV e' lateral:   8.27 cm/s LV PW:         1.18 cm  LV E/e' lateral: 10.3 LV IVS:        1.19 cm  LV e' medial:    7.40 cm/s LVOT diam:     2.20 cm  LV E/e' medial:  11.5 LV SV:         67 LV SV Index:   30 LVOT Area:     3.80 cm  RIGHT VENTRICLE RV S prime:     14.70 cm/s LEFT ATRIUM             Index       RIGHT ATRIUM           Index LA diam:        3.90 cm 1.77 cm/m  RA Area:     12.00 cm LA Vol (A2C):   40.8 ml 18.48 ml/m RA Volume:   25.50 ml  11.55 ml/m LA Vol (A4C):   61.0 ml 27.62 ml/m LA Biplane Vol: 54.9 ml 24.86 ml/m  AORTIC VALVE                   PULMONIC VALVE AV Area (Vmax):    2.71 cm    PV Vmax:        0.92 m/s AV Area (Vmean):   2.69 cm    PV Peak grad:   3.4 mmHg AV Area (VTI):     2.86 cm    RVOT Peak grad: 2 mmHg AV Vmax:           139.00 cm/s AV Vmean:          87.100 cm/s AV VTI:            0.233 m AV Peak Grad:      7.7 mmHg AV Mean Grad:      4.0 mmHg LVOT Vmax:         99.00 cm/s LVOT Vmean:        61.700 cm/s LVOT VTI:          0.175 m LVOT/AV VTI ratio: 0.75  AORTA Ao Root diam: 2.60 cm MITRAL VALVE MV Area (PHT): 4.86 cm     SHUNTS MV Decel Time: 156 msec     Systemic VTI:  0.18 m MV E velocity: 84.90 cm/s   Systemic Diam: 2.20 cm MV A velocity: 109.00 cm/s MV E/A ratio:  0.78 Harrell Gave End MD Electronically signed by Nelva Bush MD Signature Date/Time: 10/06/2019/6:48:23 AM    Final    CT HEAD CODE STROKE WO CONTRAST  Result Date: 10/04/2019 CLINICAL DATA:  Code stroke.  Difficulty speaking, facial droop EXAM: CT HEAD WITHOUT CONTRAST TECHNIQUE: Contiguous axial images were obtained from the base of the skull through the vertex without intravenous contrast. COMPARISON:  None. FINDINGS: Brain: There is no acute intracranial hemorrhage. There are a few small areas of hypoattenuation in the right MCA territory including the right caudate, inferior right temporal gyrus,  and lateral right frontal lobe probably involving the precentral gyrus in the facial motor region. There is no acute intracranial hemorrhage. There is mild effacement of the right frontal horn. No  hydrocephalus. No extra-axial fluid collection. Vascular: There is no hyperdense vessel. Skull: Unremarkable. Sinuses/Orbits: Aerated.  Unremarkable. Other: Mastoid air cells are clear. ASPECTS [facility] Stroke Program Early CT Score) - Ganglionic level infarction (caudate, lentiform nuclei, internal capsule, insula, M1-M3 cortex): 4 - Supraganglionic infarction (M4-M6 cortex): 3 Total score (0-10 with 10 being normal): 7 IMPRESSION: No acute intracranial hemorrhage. Three small areas of acute infarction within the MCA territory. ASPECT score is 7. These results were called by telephone at the time of interpretation on 10/04/2019 at 9:37 am to provider [facility] , who verbally acknowledged these results. Electronically Signed   By: Macy Mis M.D.   On: 10/04/2019 09:45    Micro Results    Recent Results (from the past 240 hour(s))  SARS CORONAVIRUS 2 (TAT 6-24 HRS) Nasopharyngeal Nasopharyngeal Swab     Status: None   Collection Time: 10/04/19 11:37 AM   Specimen: Nasopharyngeal Swab  Result Value Ref Range Status   SARS Coronavirus 2 NEGATIVE NEGATIVE Final    Comment: (NOTE) SARS-CoV-2 target nucleic acids are NOT DETECTED. The SARS-CoV-2 RNA is generally detectable in upper and lower respiratory specimens during the acute phase of infection. Negative results do not preclude SARS-CoV-2 infection, do not rule out co-infections with other pathogens, and should not be used as the sole basis for treatment or other patient management decisions. Negative results must be combined with clinical observations, patient history, and epidemiological information. The expected result is Negative. Fact Sheet for Patients: SugarRoll.be Fact Sheet for Healthcare  Providers: https://www.woods-mathews.com/ This test is not yet approved or cleared by the [place] FDA and  has been authorized for detection and/or diagnosis of SARS-CoV-2 by FDA under an Emergency Use Authorization (EUA). This EUA will remain  in effect (meaning this test can be used) for the duration of the COVID-19 declaration under Section 56 4(b)(1) of the Act, 21 U.S.C. section 360bbb-3(b)(1), unless the authorization is terminated or revoked sooner. Performed at [facility], [place] [address], [place], [place] [address]     Today   Subjective    Georgiana Eiler today is very eager to go home.  She feels like she is back to baseline.  She will follow up with her PCP for management of blood pressure and discuss that she is on aspirin with her bariatric surgeon/PCP.  Objective   Blood pressure (!) 147/99, pulse 70, temperature 98.1 F (36.7 C), temperature source Oral, resp. rate 16, height 5\' 7"  (1.702 m), weight 111.6 kg, SpO2 97 %.   Intake/Output Summary (Last 24 hours) at 10/06/2019 1627 Last data filed at 10/06/2019 1012 Gross per 24 hour  Intake 360 ml  Output --  Net 360 ml    Exam  General: Obese female sitting up in recliner in no acute distress.  She is able to speak in full sentences and use complex multisyllabic words without difficulty. Eyes: sclera anicteric, conjuctiva mild injection bilaterally CVS: S1-S2, regular  Respiratory:  decreased air entry bilaterally secondary to decreased inspiratory effort, rales at bases  GI: NABS, soft, NT  LE: No edema.  Neuro: A/O x 3, Moving all extremities equally with normal strength, CN 3-12 intact, comprehension is intact.  As noted above patient is able to speak fluently and with complex words without difficulty.  No dysarthria. Psych: patient is logical and coherent, judgement and insight appear normal, mood and affect appropriate to situation.     Data Review   CBC w Diff:  Lab Results  Component Value Date   WBC 8.1 10/04/2019   HGB 11.1 (L) 10/04/2019   HCT 36.3 10/04/2019   PLT 219 10/04/2019   LYMPHOPCT 30 10/04/2019   MONOPCT 6 10/04/2019   EOSPCT 1 10/04/2019   BASOPCT 1 10/04/2019    CMP:  Lab Results  Component Value Date   NA 136 10/04/2019   K 3.5 10/04/2019   CL 102 10/04/2019   CO2 24 10/04/2019   BUN 12 10/04/2019   CREATININE 0.57 10/04/2019   PROT 7.2 10/04/2019   ALBUMIN 4.1 10/04/2019   BILITOT 0.5 10/04/2019   ALKPHOS 60 10/04/2019   AST 19 10/04/2019   ALT 14 10/04/2019  .   Total Time in preparing paper work, data evaluation and todays exam - 35 minutes  Vashti Hey M.D on 10/06/2019 at 4:27 PM  [facility]   Office  [phone]

## 2019-10-08 ENCOUNTER — Other Ambulatory Visit: Payer: Self-pay

## 2019-10-08 ENCOUNTER — Ambulatory Visit (INDEPENDENT_AMBULATORY_CARE_PROVIDER_SITE_OTHER): Payer: Managed Care, Other (non HMO) | Admitting: Primary Care

## 2019-10-08 ENCOUNTER — Encounter: Payer: Self-pay | Admitting: Primary Care

## 2019-10-08 VITALS — BP 136/86 | HR 80 | Temp 97.0°F | Ht 67.0 in | Wt 244.8 lb

## 2019-10-08 DIAGNOSIS — I10 Essential (primary) hypertension: Secondary | ICD-10-CM

## 2019-10-08 DIAGNOSIS — Z8673 Personal history of transient ischemic attack (TIA), and cerebral infarction without residual deficits: Secondary | ICD-10-CM

## 2019-10-08 DIAGNOSIS — Z9889 Other specified postprocedural states: Secondary | ICD-10-CM

## 2019-10-08 DIAGNOSIS — F411 Generalized anxiety disorder: Secondary | ICD-10-CM | POA: Diagnosis not present

## 2019-10-08 MED ORDER — BUPROPION HCL ER (XL) 150 MG PO TB24: 150.0000 mg | ORAL_TABLET | Freq: Every day | ORAL | 3 refills | Status: DC

## 2019-10-08 MED ORDER — FLUOXETINE HCL 20 MG PO CAPS: 20.0000 mg | ORAL_CAPSULE | Freq: Every day | ORAL | 3 refills | Status: DC

## 2019-10-08 MED ORDER — ROSUVASTATIN CALCIUM 20 MG PO TABS: 20.0000 mg | ORAL_TABLET | Freq: Every evening | ORAL | 3 refills | Status: DC

## 2019-10-08 NOTE — Patient Instructions (Addendum)
Follow up with Dr. Melrose Nakayama as scheduled.  Start rosuvastatin (Crestor) 20 mg and take every evening for cholesterol.  Monitor your blood pressure at home as discussed.  Schedule a lab only appointment for 2 months for cholesterol check.  It was a pleasure to meet you today! Please don't hesitate to call or message me with any questions. Welcome to [facility]!

## 2019-10-08 NOTE — Assessment & Plan Note (Signed)
Stable in the office today, especially in the setting of recent CVA. Will have her start monitoring BP at home. She will also touch base with neurology to learn about her suggested BP goal range.

## 2019-10-08 NOTE — Assessment & Plan Note (Signed)
Doing well on current regimen of fluoxetine and bupropion. Continue same. Refills provided.

## 2019-10-08 NOTE — Assessment & Plan Note (Signed)
Diagnosed last week, also with hypertensive urgency. Initiated on clopidogrel and aspirin, did not go home with statin prescription.   Exam today without acute changes, appears very well. LDL goal of <70 so we will initiate Crestor 20 mg.  Monitor BP, right now will avoid any antihypertensive agents. Follow up with neurology as scheduled.  Hospital labs, imaging, notes reviewed.

## 2019-10-08 NOTE — Assessment & Plan Note (Signed)
Gastric sleeve in 2018, lost 80 pounds initially, has regained 10-15 pounds.  She is working on improving her diet, exercise when able. She is aware that she needs to continue to work on weight loss.

## 2019-10-08 NOTE — Progress Notes (Signed)
Subjective:    Patient ID: Jessica Hampton, female    DOB: March 06, 1971, 49 y.o.   MRN: [id number]  HPI  This visit occurred during the SARS-CoV-2 public health emergency.  Safety protocols were in place, including screening questions prior to the visit, additional usage of staff PPE, and extensive cleaning of exam room while observing appropriate contact time as indicated for disinfecting solutions.   Jessica Hampton is a 49 year old female who presents today to establish care and for hospital follow up.  1) CVA: New diagnosis of right MCA stroke.  She presented to [facility] via EMS for a chief complaint of sudden onset of difficulty speaking, left sided facial droop. Significant history of hypertension and morbid obesity, has been off of hypertensive medication since gastric sleeve in 2018 and weight loss of 80 pounds. During her stay in the ED she was noted to be hypertensive, difficulty with word finding. CT head was negative for hemorrhage, did show areas of acute infarction. She received IV labetalol and admitted for treatment.  During her hospital stay she was initiated on aspirin, clopidogrel, high intensity statin therapy. MRI of brain confirms strokes in right MCA territory. Symptoms nearly resolved during stay. She was evaluated by PT/OT. She had minimal hypertension during stay but this was not treated as it needs to be permissive. She underwent echocardiogram which showed LVEF of 60-65%, mild LVH, grade 1 diastolic dysfunction. She was discharged home on 10/06/19 with recommendations for neurology and PCP follow up.   Since her hospital stay she's doing better. Residual symptoms of "having to think about my words at times" but nothing else. She denies headaches, dizziness, numbness/tinlging, facial drooping, unilateral weakness. She has an appointment with Dr. Melrose Nakayama (neuro) next week. She was not provided with a prescription for statin therapy, LDL was 95 during stay.  She denies family history  of stroke but does have history of carotid artery stenosis, hyperlipidemia, hypertension. She ordered a BP cuff that should arrive tomorrow. 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   BP Readings from Last 3 Encounters:  10/08/19 136/86  10/06/19 (!) 147/99  10/03/16 126/70   2) History of Gastric Sleeve: Surgery in 2018, lost 80 pounds at the time. Has gained 10-15 pounds since surgery and is working to get back on track.   3) GAD: Chronic over the last several years. Currently managed on fluoxetine 20 mg and bupropion XL 150 mg. She denies depression. Doing well on this regimen.   4) Essential Hypertension: Prior history of hypertension and was on metoprolol succinate XL 25 mg and another antihypertensive agent for which she cannot recall. She recently ordered a BP cuff which should arrive tomorrow.  Review of Systems  Constitutional: Negative for unexpected weight change.  Eyes: Negative for visual disturbance.  Respiratory: Negative for shortness of breath.   Cardiovascular: Negative for chest pain.  Neurological: Negative for dizziness and headaches.       See HPI  Psychiatric/Behavioral: The patient is not nervous/anxious.        Past Medical History:  Diagnosis Date  . Anemia   . Complication of anesthesia   . Family history of adverse reaction to anesthesia    MOM NAUSEATED  . GERD (gastroesophageal reflux disease)    OCC  . Hypertension   . PONV (postoperative nausea and vomiting)    NAUSEATED     Social History   Socioeconomic History  . Marital status:  Married    Spouse name: Not on file  . Number of children: Not on file  . Years of education: Not on file  . Highest education level: Not on file  Occupational History  . Occupation: Soil scientist  Tobacco Use  . Smoking status: Never Smoker  . Smokeless tobacco: Never Used  Substance and Sexual Activity  . Alcohol use: No  . Drug use: No  . Sexual activity: Not on  file  Other Topics Concern  . Not on file  Social History Narrative   Lives with husband   Social Determinants of Health   Financial Resource Strain:   . Difficulty of Paying Living Expenses:   Food Insecurity:   . Worried About Charity fundraiser in the Last Year:   . Arboriculturist in the Last Year:   Transportation Needs:   . Film/video editor (Medical):   Marland Kitchen Lack of Transportation (Non-Medical):   Physical Activity:   . Days of Exercise per Week:   . Minutes of Exercise per Session:   Stress:   . Feeling of Stress :   Social Connections:   . Frequency of Communication with Friends and Family:   . Frequency of Social Gatherings with Friends and Family:   . Attends Religious Services:   . Active Member of Clubs or Organizations:   . Attends Archivist Meetings:   Marland Kitchen Marital Status:   Intimate Partner Violence:   . Fear of Current or Ex-Partner:   . Emotionally Abused:   Marland Kitchen Physically Abused:   . Sexually Abused:     Past Surgical History:  Procedure Laterality Date  . LAPAROSCOPIC GASTRIC SLEEVE RESECTION WITH HIATAL HERNIA REPAIR N/A 10/02/2016   Procedure: LAPAROSCOPIC GASTRIC SLEEVE RESECTION WITH HIATAL HERNIA REPAIR;  Surgeon: Bonner Puna, MD;  Location: [facility] ORS;  Service: General;  Laterality: N/A;  . TUBAL LIGATION  2004    Family History  Problem Relation Age of Onset  . Hypertension Mother   . Hyperlipidemia Father   . Hypertension Father     Allergies  Allergen Reactions  . Nsaids Other (See Comments)    History of gastric bypass     Current Outpatient Medications on File Prior to Visit  Medication Sig Dispense Refill  . aspirin EC 81 MG EC tablet Take 1 tablet (81 mg total) by mouth daily. 30 tablet 0  . clopidogrel (PLAVIX) 75 MG tablet Take 1 tablet (75 mg total) by mouth daily. 30 tablet 0  . Multiple Vitamins-Minerals (BARIATRIC MULTIVITAMINS/IRON PO) Take 1 tablet by mouth daily.     No current facility-administered  medications on file prior to visit.    BP 136/86   Pulse 80   Temp (!) 97 F (36.1 C) (Temporal)   Ht 5\' 7"  (1.702 m)   Wt 244 lb 12 oz (111 kg)   LMP 09/06/2019   SpO2 98%   BMI 38.33 kg/m    Objective:   Physical Exam  Constitutional: She is oriented to person, place, and time. She appears well-nourished.  Cardiovascular: Normal rate and regular rhythm.  Respiratory: Effort normal and breath sounds normal.  Musculoskeletal:     Cervical back: Neck supple.     Comments: 5/5 strength bilaterally to extremities.   Neurological: She is alert and oriented to person, place, and time. No cranial nerve deficit.  Speaking clearly without pauses or with difficulty word finding.  Skin: Skin is warm and dry.  Psychiatric: She has a  normal mood and affect.           Assessment & Plan:

## 2019-10-10 NOTE — ED Notes (Signed)
Swallow Screen performed prior to administering PO ASA.

## 2019-10-19 ENCOUNTER — Encounter: Payer: Self-pay | Admitting: Internal Medicine

## 2019-10-19 ENCOUNTER — Ambulatory Visit (INDEPENDENT_AMBULATORY_CARE_PROVIDER_SITE_OTHER): Payer: Managed Care, Other (non HMO) | Admitting: Internal Medicine

## 2019-10-19 ENCOUNTER — Telehealth: Payer: Self-pay | Admitting: *Deleted

## 2019-10-19 VITALS — Temp 99.9°F

## 2019-10-19 DIAGNOSIS — K529 Noninfective gastroenteritis and colitis, unspecified: Secondary | ICD-10-CM

## 2019-10-19 NOTE — Patient Instructions (Signed)

## 2019-10-19 NOTE — Progress Notes (Signed)
Virtual Visit via Video Note  I connected with Jessica Hampton on 10/19/19 at 11:30 AM EDT by a video enabled telemedicine application and verified that I am speaking with the correct person using two identifiers.  Location: Patient: Home Provider: Office   I discussed the limitations of evaluation and management by telemedicine and the availability of in person appointments. The patient expressed understanding and agreed to proceed.  History of Present Illness:  Patient reports nausea, vomiting and diarrhea.  She reports this started abruptly at 4 AM on Sunday.  She reports symptoms lasted for a few hours but have now resolved.  She did make a salad with grilled chicken at home but does feel like the grilled chicken was cooked fully.  She reports her husband had similar symptoms but his have also resolved.  She is running a fever this morning up to 100.1.  She has had some chills but denies body aches.  She denies headache, runny nose, nasal congestion, ear pain, sore throat, loss of taste or smell, cough or shortness of breath.  She has tried Zofran with good relief of symptoms.    Past Medical History:  Diagnosis Date  . Acute CVA (cerebrovascular accident) ([place]) 10/04/2019  . Anemia   . Complication of anesthesia   . Family history of adverse reaction to anesthesia    MOM NAUSEATED  . GERD (gastroesophageal reflux disease)    OCC  . Hypertension   . PONV (postoperative nausea and vomiting)    NAUSEATED    Current Outpatient Medications  Medication Sig Dispense Refill  . aspirin EC 81 MG EC tablet Take 1 tablet (81 mg total) by mouth daily. 30 tablet 0  . buPROPion (WELLBUTRIN XL) 150 MG 24 hr tablet Take 1 tablet (150 mg total) by mouth daily. 90 tablet 3  . clopidogrel (PLAVIX) 75 MG tablet Take 1 tablet (75 mg total) by mouth daily. 30 tablet 0  . FLUoxetine (PROZAC) 20 MG capsule Take 1 capsule (20 mg total) by mouth daily. For anxiety. 90 capsule 3  . Multiple  Vitamins-Minerals (BARIATRIC MULTIVITAMINS/IRON PO) Take 1 tablet by mouth daily.    . rosuvastatin (CRESTOR) 20 MG tablet Take 1 tablet (20 mg total) by mouth every evening. For cholesterol. 90 tablet 3   No current facility-administered medications for this visit.    Allergies  Allergen Reactions  . Nsaids Other (See Comments)    History of gastric bypass     Family History  Problem Relation Age of Onset  . Hypertension Mother   . Hyperlipidemia Father   . Hypertension Father     Social History   Socioeconomic History  . Marital status: Married    Spouse name: Not on file  . Number of children: Not on file  . Years of education: Not on file  . Highest education level: Not on file  Occupational History  . Occupation: Soil scientist  Tobacco Use  . Smoking status: Never Smoker  . Smokeless tobacco: Never Used  Substance and Sexual Activity  . Alcohol use: No  . Drug use: No  . Sexual activity: Not on file  Other Topics Concern  . Not on file  Social History Narrative   Lives with husband   Social Determinants of Health   Financial Resource Strain:   . Difficulty of Paying Living Expenses:   Food Insecurity:   . Worried About Charity fundraiser in the Last Year:   . [place] in the  Last Year:   Transportation Needs:   . Film/video editor (Medical):   Marland Kitchen Lack of Transportation (Non-Medical):   Physical Activity:   . Days of Exercise per Week:   . Minutes of Exercise per Session:   Stress:   . Feeling of Stress :   Social Connections:   . Frequency of Communication with Friends and Family:   . Frequency of Social Gatherings with Friends and Family:   . Attends Religious Services:   . Active Member of Clubs or Organizations:   . Attends Archivist Meetings:   Marland Kitchen Marital Status:   Intimate Partner Violence:   . Fear of Current or Ex-Partner:   . Emotionally Abused:   Marland Kitchen Physically Abused:   . Sexually Abused:      Constitutional: Pt  reports fever and chills. Denies fever, malaise, fatigue, headache or abrupt weight changes.  Respiratory: Denies difficulty breathing, shortness of breath, cough or sputum production.   Cardiovascular: Denies chest pain, chest tightness, palpitations or swelling in the hands or feet.  Gastrointestinal: Pt reports nausea, vomiting, and diarrhea. Denies abdominal pain, bloating, constipation, or blood in the stool.  GU: Denies urgency, frequency, pain with urination, burning sensation, blood in urine, odor or discharge.  No other specific complaints in a complete review of systems (except as listed in HPI above).  Observations/Objective:  Temp 99.9 F (37.7 C) (Oral)  Wt Readings from Last 3 Encounters:  10/08/19 244 lb 12 oz (111 kg)  10/04/19 246 lb (111.6 kg)  10/02/16 270 lb (122.5 kg)    General: Appears her stated age, obese, in NAD. Pulmonary/Chest: Normal effort. No respiratory distress.  Neurological: Alert and oriented.   BMET    Component Value Date/Time   NA 136 10/04/2019 0917   K 3.5 10/04/2019 0917   CL 102 10/04/2019 0917   CO2 24 10/04/2019 0917   GLUCOSE 101 (H) 10/04/2019 0917   BUN 12 10/04/2019 0917   CREATININE 0.57 10/04/2019 0917   CALCIUM 8.8 (L) 10/04/2019 0917   GFRNONAA >60 10/04/2019 0917   GFRAA >60 10/04/2019 0917    Lipid Panel     Component Value Date/Time   CHOL 197 10/05/2019 0442   TRIG 220 (H) 10/05/2019 0442   HDL 58 10/05/2019 0442   CHOLHDL 3.4 10/05/2019 0442   VLDL 44 (H) 10/05/2019 0442   LDLCALC 95 10/05/2019 0442    CBC    Component Value Date/Time   WBC 8.1 10/04/2019 0917   RBC 4.62 10/04/2019 0917   HGB 11.1 (L) 10/04/2019 0917   HCT 36.3 10/04/2019 0917   PLT 219 10/04/2019 0917   MCV 78.6 (L) 10/04/2019 0917   MCH 24.0 (L) 10/04/2019 0917   MCHC 30.6 10/04/2019 0917   RDW 18.3 (H) 10/04/2019 0917   LYMPHSABS 2.4 10/04/2019 0917   MONOABS 0.5 10/04/2019 0917   EOSABS 0.1 10/04/2019 0917   BASOSABS 0.0  10/04/2019 0917    Hgb A1C Lab Results  Component Value Date   HGBA1C 5.3 10/05/2019        Assessment and Plan:  Gastroenteritis:  ? Viral vs food related Symptoms resolved Rest and get plenty of liquids today Advance diet and tolerated  RTC if symptoms persist or worsen  Follow Up Instructions:    I discussed the assessment and treatment plan with the patient. The patient was provided an opportunity to ask questions and all were answered. The patient agreed with the plan and demonstrated an understanding of the instructions.  The patient was advised to call back or seek an in-person evaluation if the symptoms worsen or if the condition fails to improve as anticipated.    Webb Silversmith, NP

## 2019-10-19 NOTE — Telephone Encounter (Signed)
Patient called stating that she had vomiting and diarrhea yesterday. Patient stated she had a fever last night of 101-102. Patient stated that she has had some body aches. Patient stated that she had an appointment with a neurologist Friday and they did a full blood panel. Patient stated that the  blood work for covid antibodies came back negative.. Patient scheduled for a virtual visit today with Webb Silversmith NP. Patient stated that she did have a fever first thing this morning, but other symptoms have subsided. Patient did request a virtual visit to decide if this could be a stomach bug or if she should be tested for covid.

## 2019-10-19 NOTE — Telephone Encounter (Signed)
See visit note

## 2019-11-06 ENCOUNTER — Ambulatory Visit: Payer: Managed Care, Other (non HMO) | Attending: Internal Medicine

## 2019-11-06 ENCOUNTER — Other Ambulatory Visit: Payer: Self-pay

## 2019-11-06 DIAGNOSIS — Z23 Encounter for immunization: Secondary | ICD-10-CM

## 2019-11-06 NOTE — Progress Notes (Signed)
   Covid-19 Vaccination Clinic  Name:  Jessica Hampton    MRN: [id number] DOB: Dec 01, 1970  11/06/2019  Jessica Hampton was observed post Covid-19 immunization for 15 minutes without incident. She was provided with Vaccine Information Sheet and instruction to access the V-Safe system.   Jessica Hampton was instructed to call 911 with any severe reactions post vaccine: Marland Kitchen Difficulty breathing  . Swelling of face and throat  . A fast heartbeat  . A bad rash all over body  . Dizziness and weakness   Immunizations Administered    Name Date Dose VIS Date Route   Pfizer COVID-19 Vaccine 11/06/2019  9:58 AM 0.3 mL 07/10/2019 Intramuscular   Manufacturer: [place]   Lot: [id number]   [place]: [id number]

## 2019-11-17 ENCOUNTER — Other Ambulatory Visit
Admission: RE | Admit: 2019-11-17 | Discharge: 2019-11-17 | Disposition: A | Discharge status: Home / Self Care | Payer: Managed Care, Other (non HMO) | Source: Ambulatory Visit | Attending: Cardiology | Admitting: Cardiology

## 2019-11-17 ENCOUNTER — Other Ambulatory Visit: Payer: Self-pay

## 2019-11-17 DIAGNOSIS — Z20822 Contact with and (suspected) exposure to covid-19: Secondary | ICD-10-CM | POA: Diagnosis not present

## 2019-11-17 DIAGNOSIS — Z01812 Encounter for preprocedural laboratory examination: Secondary | ICD-10-CM | POA: Diagnosis not present

## 2019-11-17 LAB — SARS CORONAVIRUS 2 (TAT 6-24 HRS): SARS Coronavirus 2: NEGATIVE

## 2019-11-19 ENCOUNTER — Ambulatory Visit
Admission: RE | Admit: 2019-11-19 | Discharge: 2019-11-19 | Disposition: A | Discharge status: Home / Self Care | Payer: Managed Care, Other (non HMO) | Attending: Cardiology | Admitting: Cardiology

## 2019-11-19 ENCOUNTER — Other Ambulatory Visit: Payer: Self-pay

## 2019-11-19 ENCOUNTER — Encounter: Payer: Self-pay | Admitting: Cardiology

## 2019-11-19 ENCOUNTER — Encounter
Admission: RE | Disposition: A | Discharge status: Home / Self Care | Payer: Self-pay | Source: Home / Self Care | Attending: Cardiology

## 2019-11-19 DIAGNOSIS — E785 Hyperlipidemia, unspecified: Secondary | ICD-10-CM | POA: Diagnosis not present

## 2019-11-19 DIAGNOSIS — Z6836 Body mass index (BMI) 36.0-36.9, adult: Secondary | ICD-10-CM | POA: Insufficient documentation

## 2019-11-19 DIAGNOSIS — Z7982 Long term (current) use of aspirin: Secondary | ICD-10-CM | POA: Diagnosis not present

## 2019-11-19 DIAGNOSIS — Z9884 Bariatric surgery status: Secondary | ICD-10-CM | POA: Diagnosis not present

## 2019-11-19 DIAGNOSIS — I10 Essential (primary) hypertension: Secondary | ICD-10-CM | POA: Insufficient documentation

## 2019-11-19 DIAGNOSIS — Z886 Allergy status to analgesic agent status: Secondary | ICD-10-CM | POA: Insufficient documentation

## 2019-11-19 DIAGNOSIS — I639 Cerebral infarction, unspecified: Secondary | ICD-10-CM | POA: Diagnosis not present

## 2019-11-19 DIAGNOSIS — Z79899 Other long term (current) drug therapy: Secondary | ICD-10-CM | POA: Insufficient documentation

## 2019-11-19 DIAGNOSIS — Z8249 Family history of ischemic heart disease and other diseases of the circulatory system: Secondary | ICD-10-CM | POA: Insufficient documentation

## 2019-11-19 DIAGNOSIS — F419 Anxiety disorder, unspecified: Secondary | ICD-10-CM | POA: Insufficient documentation

## 2019-11-19 HISTORY — PX: LOOP RECORDER INSERTION: EP1214

## 2019-11-19 SURGERY — LOOP RECORDER INSERTION
Anesthesia: LOCAL

## 2019-11-19 MED ORDER — LIDOCAINE-EPINEPHRINE (PF) 1 %-1:200000 IJ SOLN
INTRAMUSCULAR | Status: DC | PRN
  Administered 2019-11-19: 20 mL

## 2019-11-19 MED ORDER — LIDOCAINE-EPINEPHRINE (PF) 1 %-1:200000 IJ SOLN
INTRAMUSCULAR | Status: AC
  Filled 2019-11-19: qty 10

## 2019-11-19 SURGICAL SUPPLY — 2 items
LOOP REVEAL LINQ LNQ11 (Prosthesis & Implant Heart) ×2 IMPLANT
PACK LOOP INSERTION (CUSTOM PROCEDURE TRAY) ×2 IMPLANT

## 2019-11-19 NOTE — Discharge Instructions (Signed)
Implantable Loop Recorder Placement, Care After This sheet gives you information about how to care for yourself after your procedure. Your health care provider may also give you more specific instructions. If you have problems or questions, contact your health care provider. What can I expect after the procedure? After the procedure, it is common to have:  Soreness or discomfort near the incision.  Some swelling or bruising near the incision. Follow these instructions at home: Incision care   Follow instructions from your health care provider about how to take care of your incision. Make sure you: ? Wash your hands with soap and water before you change your bandage (dressing). If soap and water are not available, use hand sanitizer. ? Change your dressing as told by your health care provider. ? Keep your dressing dry. ? Leave stitches (sutures), skin glue, or adhesive strips in place. These skin closures may need to stay in place for 2 weeks or longer. If adhesive strip edges start to loosen and curl up, you may trim the loose edges. Do not remove adhesive strips completely unless your health care provider tells you to do that.  Check your incision area every day for signs of infection. Check for: ? Redness, swelling, or pain. ? Fluid or blood. ? Warmth. ? Pus or a bad smell.  Do not take baths, swim, or use a hot tub until your health care provider approves. Ask your health care provider if you can take showers. Activity   Return to your normal activities as told by your health care provider. Ask your health care provider what activities are safe for you.  Do not drive for 24 hours if you were given a sedative during your procedure. General instructions  Follow instructions from your health care provider about how to manage your implantable loop recorder and transmit the information. Learn how to activate a recording if this is necessary for your type of device.  Do not go through  a metal detection gate, and do not let someone hold a metal detector over your chest. Show your ID card.  Do not have an MRI unless you check with your health care provider first.  Take over-the-counter and prescription medicines only as told by your health care provider.  Keep all follow-up visits as told by your health care provider. This is important. Contact a health care provider if:  You have redness, swelling, or pain around your incision.  You have a fever.  You have pain that is not relieved by your pain medicine.  You have triggered your device because of fainting (syncope) or because of a heartbeat that feels like it is racing, slow, fluttering, or skipping (palpitations). Get help right away if you have:  Chest pain.  Difficulty breathing. Summary  After the procedure, it is common to have soreness or discomfort near the incision.  Change your dressing as told by your health care provider.  Follow instructions from your health care provider about how to manage your implantable loop recorder and transmit the information.  Keep all follow-up visits as told by your health care provider. This is important. This information is not intended to replace advice given to you by your health care provider. Make sure you discuss any questions you have with your health care provider. Document Revised: 08/31/2017 Document Reviewed: 08/31/2017 Elsevier Patient Education  2020 Elsevier Inc.  

## 2019-11-23 ENCOUNTER — Other Ambulatory Visit: Payer: Self-pay | Admitting: Cardiology

## 2019-11-24 ENCOUNTER — Ambulatory Visit
Admission: RE | Admit: 2019-11-24 | Discharge: 2019-11-24 | Disposition: A | Discharge status: Home / Self Care | Payer: Managed Care, Other (non HMO) | Source: Home / Self Care | Attending: Cardiology | Admitting: Cardiology

## 2019-11-24 ENCOUNTER — Ambulatory Visit
Admission: RE | Admit: 2019-11-24 | Discharge: 2019-11-24 | Disposition: A | Discharge status: Home / Self Care | Payer: Managed Care, Other (non HMO) | Attending: Cardiology | Admitting: Cardiology

## 2019-11-24 ENCOUNTER — Other Ambulatory Visit: Payer: Self-pay

## 2019-11-24 ENCOUNTER — Encounter
Admission: RE | Disposition: A | Discharge status: Home / Self Care | Payer: Self-pay | Source: Home / Self Care | Attending: Cardiology

## 2019-11-24 DIAGNOSIS — Z8249 Family history of ischemic heart disease and other diseases of the circulatory system: Secondary | ICD-10-CM | POA: Insufficient documentation

## 2019-11-24 DIAGNOSIS — Z79899 Other long term (current) drug therapy: Secondary | ICD-10-CM | POA: Diagnosis not present

## 2019-11-24 DIAGNOSIS — Z7902 Long term (current) use of antithrombotics/antiplatelets: Secondary | ICD-10-CM | POA: Diagnosis not present

## 2019-11-24 DIAGNOSIS — I10 Essential (primary) hypertension: Secondary | ICD-10-CM | POA: Diagnosis present

## 2019-11-24 DIAGNOSIS — Z886 Allergy status to analgesic agent status: Secondary | ICD-10-CM | POA: Insufficient documentation

## 2019-11-24 DIAGNOSIS — Z7982 Long term (current) use of aspirin: Secondary | ICD-10-CM | POA: Diagnosis not present

## 2019-11-24 DIAGNOSIS — Z8673 Personal history of transient ischemic attack (TIA), and cerebral infarction without residual deficits: Secondary | ICD-10-CM | POA: Diagnosis not present

## 2019-11-24 DIAGNOSIS — F419 Anxiety disorder, unspecified: Secondary | ICD-10-CM | POA: Diagnosis not present

## 2019-11-24 DIAGNOSIS — E785 Hyperlipidemia, unspecified: Secondary | ICD-10-CM | POA: Insufficient documentation

## 2019-11-24 HISTORY — PX: TEE WITHOUT CARDIOVERSION: SHX5443

## 2019-11-24 SURGERY — ECHOCARDIOGRAM, TRANSESOPHAGEAL
Anesthesia: Moderate Sedation

## 2019-11-24 MED ORDER — FENTANYL CITRATE (PF) 100 MCG/2ML IJ SOLN
INTRAMUSCULAR | Status: AC | PRN
  Administered 2019-11-24 (×2): 25 ug via INTRAVENOUS
  Administered 2019-11-24: 50 ug via INTRAVENOUS
  Administered 2019-11-24 (×2): 25 ug via INTRAVENOUS

## 2019-11-24 MED ORDER — BUTAMBEN-TETRACAINE-BENZOCAINE 2-2-14 % EX AERO
INHALATION_SPRAY | CUTANEOUS | Status: AC
  Filled 2019-11-24: qty 5

## 2019-11-24 MED ORDER — FENTANYL CITRATE (PF) 100 MCG/2ML IJ SOLN
INTRAMUSCULAR | Status: AC
  Filled 2019-11-24: qty 2

## 2019-11-24 MED ORDER — LIDOCAINE VISCOUS HCL 2 % MT SOLN
OROMUCOSAL | Status: AC
  Administered 2019-11-24: 30 mL
  Filled 2019-11-24: qty 15

## 2019-11-24 MED ORDER — SODIUM CHLORIDE 0.9 % IV SOLN: INTRAVENOUS | Status: DC

## 2019-11-24 MED ORDER — MIDAZOLAM HCL 2 MG/2ML IJ SOLN
INTRAMUSCULAR | Status: AC
  Filled 2019-11-24: qty 2

## 2019-11-24 MED ORDER — MIDAZOLAM HCL 2 MG/2ML IJ SOLN
INTRAMUSCULAR | Status: AC | PRN
  Administered 2019-11-24 (×4): 1 mg via INTRAVENOUS
  Administered 2019-11-24: 2 mg via INTRAVENOUS

## 2019-11-24 MED ORDER — MIDAZOLAM HCL 5 MG/5ML IJ SOLN
INTRAMUSCULAR | Status: AC
  Filled 2019-11-24: qty 5

## 2019-11-24 MED ORDER — SODIUM CHLORIDE FLUSH 0.9 % IV SOLN
INTRAVENOUS | Status: AC
  Filled 2019-11-24: qty 10

## 2019-11-24 NOTE — Discharge Instructions (Signed)
Transesophageal Echocardiogram Transesophageal echocardiogram (TEE) is a test that uses sound waves to take pictures of your heart. TEE is done by passing a flexible tube down the esophagus. The esophagus is the tube that carries food from the throat to the stomach. The pictures give detailed images of your heart. This can help your doctor see if there are problems with your heart. What happens before the procedure? Staying hydrated Follow instructions from your doctor about hydration, which may include:  Up to 3 hours before the procedure - you may continue to drink clear liquids, such as: ? Water. ? Clear fruit juice. ? Black coffee. ? Plain tea.  Eating and drinking Follow instructions from your doctor about eating and drinking, which may include:  8 hours before the procedure - stop eating heavy meals or foods such as meat, fried foods, or fatty foods.  6 hours before the procedure - stop eating light meals or foods, such as toast or cereal.  6 hours before the procedure - stop drinking milk or drinks that contain milk.  3 hours before the procedure - stop drinking clear liquids. General instructions  You will need to take out any dentures or retainers.  Plan to have someone take you home from the hospital or clinic.  If you will be going home right after the procedure, plan to have someone with you for 24 hours.  Ask your doctor about: ? Changing or stopping your normal medicines. This is important if you take diabetes medicines or blood thinners. ? Taking over-the-counter medicines, vitamins, herbs, and supplements. ? Taking medicines such as aspirin and ibuprofen. These medicines can thin your blood. Do not take these medicines unless your doctor tells you to take them. What happens during the procedure?  To lower your risk of infection, your doctors will wash or clean their hands.  An IV will be put into one of your veins.  You will be given a medicine to help you  relax (sedative).  A medicine may be sprayed or gargled. This numbs the back of your throat.  Your blood pressure, heart rate, and breathing will be watched.  You may be asked to lay on your left side.  A bite block will be placed in your mouth. This keeps you from biting the tube.  The tip of the TEE probe will be placed into the back of your mouth.  You will be asked to swallow.  Your doctor will take pictures of your heart.  The probe and bite block will be taken out. The procedure may vary among doctors and hospitals. What happens after the procedure?   Your blood pressure, heart rate, breathing rate, and blood oxygen level will be watched until the medicines you were given have worn off.  When you first wake up, your throat may feel sore and numb. This will get better over time. You will not be allowed to eat or drink until the numbness has gone away.  Do not drive for 24 hours if you were given a medicine to help you relax. Summary  TEE is a test that uses sound waves to take pictures of your heart.  You will be given a medicine to help you relax.  Do not drive for 24 hours if you were given a medicine to help you relax. This information is not intended to replace advice given to you by your health care provider. Make sure you discuss any questions you have with your health care provider. Document Revised:   04/04/2018 Document Reviewed: 10/17/2016 Elsevier Patient Education  2020 Elsevier Inc.  

## 2019-11-24 NOTE — Procedures (Signed)
   TRANSESOPHAGEAL ECHOCARDIOGRAM   NAME:  Jessica Hampton   MRN: [id number] DOB:  [birth year]   ADMIT DATE: 11/24/2019  INDICATIONS:   PROCEDURE:   Informed consent was obtained prior to the procedure. The risks, benefits and alternatives for the procedure were discussed and the patient comprehended these risks.  Risks include, but are not limited to, cough, sore throat, vomiting, nausea, somnolence, esophageal and stomach trauma or perforation, bleeding, low blood pressure, aspiration, pneumonia, infection, trauma to the teeth and death.    After a procedural time-out, the patient was given 6 mg versed and 150 mcg fentanyl to achieve moderate sedation for 18 min.  The oropharynx was anesthetized 3 cc of topical 1% viscous lidocaine.  The transesophageal probe was inserted in the esophagus and stomach without difficulty and multiple views were obtained. I was present for the entire procedure.    COMPLICATIONS:    There were no immediate complications.  FINDINGS:  LEFT VENTRICLE: EF = 55%. No regional wall motion abnormalities.  RIGHT VENTRICLE: Normal size and function.   LEFT ATRIUM: Normal size. No masses or thrombus  LEFT ATRIAL APPENDAGE: No thrombus. Normal flow  RIGHT ATRIUM: Normal size. No thrombus  AORTIC VALVE:  Trileaflet. Trace ai. No thrombus or vegetations  MITRAL VALVE:    Normal. No thrombus or vegetation.No mr  TRICUSPID VALVE: Normal. Trace tr.   PULMONIC VALVE: Grossly normal.  INTERATRIAL SEPTUM: No PFO or ASD. Agitated saline contrast was used.   PERICARDIUM: No effusion  DESCENDING AORTA: Normal  CONCLUSION: Normal tee. No thrombus or vegetations. No cardiac source of emboli

## 2019-11-24 NOTE — H&P (Signed)
Visit   Chief Complaint: Chief Complaint  Patient presents with  . Establish Care  per Dr Manuella Ghazi recent CVA-10-04-19  Date of Service: 11/13/2019 Date of Birth: [birth year] PCP: Alma Friendly, NP  History of Present Illness: Ms. Halleran is a 49 y.o.female patient who is referred for evaluation of cryptogenic stroke. Patient presented to [facility] ED 10/04/2019 with difficulty with speech and short droop. Head CT revealed 3 new areas of infarct in right MCA distribution. Brain MRI also revealed acute and late acute infarct in right MCA distribution. CT angiography revealed segmental high-grade stenosis right M1 MCA. 2D echocardiogram 10/06/2019 revealed LVEF 60 to 65%. Patient was discharged home on aspirin and clopidogrel currently on aspirin alone. She was seen by Dr. Manuella Ghazi and is referred to rule out patent foramen ovale and paroxysmal atrial fibrillation. The patient denies chest pain or shortness of breath. She denies palpitations or heart racing. She denies peripheral edema. She denies recurrent strokelike symptoms. The patient has a history of essential hypertension, no longer on BP medications following gastric sleeve surgery 09/2016.  Past Medical and Surgical History  Past Medical History Past Medical History:  Diagnosis Date  . Anxiety  . Essential hypertension, benign 04/15/2007  Qualifier: Diagnosis of By: Council Mechanic MD, Hilaria Ota  . History of stroke  . Hyperlipidemia  . Morbid obesity (CMS-HCC) 10/02/2016   Past Surgical History She has a past surgical history that includes gastric sleeve and Tubal ligation.   Medications and Allergies  Current Medications Current Outpatient Medications  Medication Sig Dispense Refill  . aspirin 325 MG tablet Take 325 mg by mouth once daily  . buPROPion (WELLBUTRIN XL) 150 MG XL tablet Take 150 mg by mouth once daily  . FLUoxetine (PROZAC) 20 MG capsule  . rosuvastatin (CRESTOR) 20 MG tablet Take 20 mg by mouth once daily   No current  facility-administered medications for this visit.   Allergies Ibuprofen and Nsaids (non-steroidal anti-inflammatory drug)  Social and Family History  Social History reports that she has never smoked. She has never used smokeless tobacco. She reports previous alcohol use. She reports previous drug use.  Family History family history includes Cancer in her father; High blood pressure (Hypertension) in her maternal grandmother and mother.   Review of Systems   Review of Systems: The patient denies chest pain, shortness of breath, orthopnea, paroxysmal nocturnal dyspnea, pedal edema, palpitations, heart racing, presyncope, syncope. Review of 12 Systems is negative except as described above.  Physical Examination   Vitals:BP 142/82  Pulse 90  Ht 170.2 cm (5\' 7" )  Wt (!) 106.3 kg (234 lb 6.4 oz)  BMI 36.71 kg/m  Ht:170.2 cm (5\' 7" ) Wt:(!) 106.3 kg (234 lb 6.4 oz) [id number] surface area is 2.24 meters squared. Body mass index is 36.71 kg/m.  HEENT: Pupils equally reactive to light and accomodation  Neck: Supple without thyromegaly, carotid pulses 2+ Lungs: clear to auscultation bilaterally; no wheezes, rales, rhonchi Heart: Regular rate and rhythm. No gallops, murmurs or rub Abdomen: soft nontender, nondistended, with normal bowel sounds Extremities: no cyanosis, clubbing, or edema Peripheral Pulses: 2+ in all extremities, 2+ femoral pulses bilaterally  Assessment   49 y.o. female with  1. Essential hypertension, benign  2. History of CVA (cerebrovascular accident)  3. History of ischemic right MCA stroke   49 year old female with recent acute and late acute infarctions right MCA territory referred to rule out patent foramen ovale and paroxysmal atrial fibrillation in the setting of cryptogenic stroke.  Plan   1.  Continue current medications 2. Transesophageal echocardiogram. Risk, benefits alternatives were explained to the patient and informed written consent was  obtained. 3. Implantable loop monitor. The risk, benefits and alternatives were explained to the patient and informed written consent was obtained.  No orders of the defined types were placed in this encounter.  Return in about 1 week (around 11/20/2019), or after TEE, LINQ.    Isaias Cowman, MD PhD [facility]   Pt seen and examined. No change from above.

## 2019-11-24 NOTE — Progress Notes (Signed)
*  PRELIMINARY RESULTS* Echocardiogram Echocardiogram Transesophageal has been performed.  Sherrie Sport 11/24/2019, 8:06 AM

## 2019-11-27 ENCOUNTER — Other Ambulatory Visit: Payer: Self-pay | Admitting: Primary Care

## 2019-11-27 DIAGNOSIS — Z8673 Personal history of transient ischemic attack (TIA), and cerebral infarction without residual deficits: Secondary | ICD-10-CM

## 2019-12-01 ENCOUNTER — Ambulatory Visit: Payer: Managed Care, Other (non HMO)

## 2019-12-02 ENCOUNTER — Ambulatory Visit: Payer: Managed Care, Other (non HMO) | Attending: Internal Medicine

## 2019-12-02 DIAGNOSIS — Z23 Encounter for immunization: Secondary | ICD-10-CM

## 2019-12-02 NOTE — Progress Notes (Signed)
   Covid-19 Vaccination Clinic  Name:  Jessica Hampton    MRN: [id number] DOB: November 24, 1970  12/02/2019  Jessica Hampton was observed post Covid-19 immunization for 15 minutes without incident. She was provided with Vaccine Information Sheet and instruction to access the V-Safe system.   Jessica Hampton was instructed to call 911 with any severe reactions post vaccine: Marland Kitchen Difficulty breathing  . Swelling of face and throat  . A fast heartbeat  . A bad rash all over body  . Dizziness and weakness   Immunizations Administered    Name Date Dose VIS Date Route   Pfizer COVID-19 Vaccine 12/02/2019 12:56 PM 0.3 mL 09/23/2018 Intramuscular   Manufacturer: [place]   Lot: [id number]   [place]: [id number]

## 2019-12-11 ENCOUNTER — Other Ambulatory Visit (INDEPENDENT_AMBULATORY_CARE_PROVIDER_SITE_OTHER): Payer: Managed Care, Other (non HMO)

## 2019-12-11 DIAGNOSIS — Z8673 Personal history of transient ischemic attack (TIA), and cerebral infarction without residual deficits: Secondary | ICD-10-CM

## 2019-12-11 LAB — LIPID PANEL
Cholesterol: 124 mg/dL (ref 0–200)
HDL: 51.6 mg/dL (ref >39.00)
LDL Cholesterol: 46 mg/dL (ref 0–99)
NonHDL: 72.71
Total CHOL/HDL Ratio: 2
Triglycerides: 135 mg/dL (ref 0.0–149.0)
VLDL: 27 mg/dL (ref 0.0–40.0)

## 2019-12-11 LAB — HEPATIC FUNCTION PANEL
ALT: 16 U/L (ref 0–35)
AST: 20 U/L (ref 0–37)
Albumin: 4.3 g/dL (ref 3.5–5.2)
Alkaline Phosphatase: 62 U/L (ref 39–117)
Bilirubin, Direct: 0.1 mg/dL (ref 0.0–0.3)
Total Bilirubin: 0.3 mg/dL (ref 0.2–1.2)
Total Protein: 7 g/dL (ref 6.0–8.3)

## 2020-01-20 ENCOUNTER — Ambulatory Visit: Payer: Self-pay

## 2020-01-20 ENCOUNTER — Encounter: Payer: Self-pay | Admitting: Emergency Medicine

## 2020-01-20 ENCOUNTER — Ambulatory Visit
Admission: EM | Admit: 2020-01-20 | Discharge: 2020-01-20 | Disposition: A | Discharge status: Home / Self Care | Payer: Managed Care, Other (non HMO) | Attending: Emergency Medicine | Admitting: Emergency Medicine

## 2020-01-20 ENCOUNTER — Other Ambulatory Visit: Payer: Self-pay

## 2020-01-20 ENCOUNTER — Ambulatory Visit (INDEPENDENT_AMBULATORY_CARE_PROVIDER_SITE_OTHER): Payer: Managed Care, Other (non HMO)

## 2020-01-20 DIAGNOSIS — M79672 Pain in left foot: Secondary | ICD-10-CM | POA: Diagnosis not present

## 2020-01-20 DIAGNOSIS — M25572 Pain in left ankle and joints of left foot: Secondary | ICD-10-CM

## 2020-01-20 DIAGNOSIS — I10 Essential (primary) hypertension: Secondary | ICD-10-CM

## 2020-01-20 NOTE — ED Provider Notes (Signed)
[facility]    CSN: [id number] Arrival date & time: 01/20/20  1643      History   Chief Complaint Chief Complaint  Patient presents with  . Foot Pain    left    HPI Jessica Hampton is a 49 y.o. female.   49 year old female presents with injury to her left foot and ankle. She was walking yesterday with her husband in her neighborhood when she was walking up a hill and turned her left foot and ankle. Had some pain but was able to walk home. Later that evening and this morning, the pain and swelling has gotten worse. 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 Has not taken anything yet for pain. No previous known injury to her foot or ankle. Other chronic health issues include hx of CVA, HTN, GERD, anxiety disorder, and history of gastric bypass. Currently on Prozac, Wellbutrin, Crestor, aspirin and multivitamin daily. Not currently on any BP meds but monitors blood pressure daily and usually <120/80.   The history is provided by the patient.    Past Medical History:  Diagnosis Date  . Acute CVA (cerebrovascular accident) ([place]) 10/04/2019  . Anemia   . Complication of anesthesia   . Family history of adverse reaction to anesthesia    MOM NAUSEATED  . GERD (gastroesophageal reflux disease)    OCC  . Hypertension   . PONV (postoperative nausea and vomiting)    NAUSEATED    Patient Active Problem List   Diagnosis Date Noted  . GAD (generalized anxiety disorder) 10/08/2019  . History of CVA (cerebrovascular accident) 10/04/2019  . Hypertensive urgency 10/04/2019  . History of gastric surgery 10/02/2016  . HYPERTENSION, BENIGN ESSENTIAL 04/15/2007  . POLYCYSTIC OVARIAN DISEASE 04/14/2007    Past Surgical History:  Procedure Laterality Date  . LAPAROSCOPIC GASTRIC SLEEVE RESECTION WITH HIATAL HERNIA REPAIR N/A 10/02/2016   Procedure: LAPAROSCOPIC GASTRIC SLEEVE RESECTION WITH HIATAL HERNIA REPAIR;  Surgeon: Bonner Puna, MD;  Location: [facility] ORS;   Service: General;  Laterality: N/A;  . LOOP RECORDER INSERTION N/A 11/19/2019   Procedure: LOOP RECORDER INSERTION;  Surgeon: Isaias Cowman, MD;  Location: [place] [facility];  Service: Cardiovascular;  Laterality: N/A;  . TEE WITHOUT CARDIOVERSION N/A 11/24/2019   Procedure: TRANSESOPHAGEAL ECHOCARDIOGRAM (TEE);  Surgeon: Teodoro Spray, MD;  Location: [facility] ORS;  Service: Cardiovascular;  Laterality: N/A;  . TUBAL LIGATION  2004    OB History   No obstetric history on file.      Home Medications    Prior to Admission medications   Medication Sig Start Date End Date Taking? Authorizing Provider  aspirin EC 325 MG tablet Take 325 mg by mouth daily with lunch.   Yes [provider]  buPROPion (WELLBUTRIN XL) 150 MG 24 hr tablet Take 1 tablet (150 mg total) by mouth daily. Patient taking differently: Take 150 mg by mouth daily with lunch.  10/08/19  Yes Pleas Koch, NP  FLUoxetine (PROZAC) 20 MG capsule Take 1 capsule (20 mg total) by mouth daily. For anxiety. Patient taking differently: Take 20 mg by mouth daily with lunch. For anxiety. 10/08/19  Yes Pleas Koch, NP  Multiple Vitamins-Minerals (BARIATRIC MULTIVITAMINS/IRON PO) Take 1 tablet by mouth daily with lunch.    Yes [provider]  rosuvastatin (CRESTOR) 20 MG tablet Take 1 tablet (20 mg total) by mouth every evening. For cholesterol. 10/08/19  Yes Pleas Koch, NP  Family History Family History  Problem Relation Age of Onset  . Hypertension Mother   . Hyperlipidemia Father   . Hypertension Father     Social History Social History   Tobacco Use  . Smoking status: Never Smoker  . Smokeless tobacco: Never Used  Vaping Use  . Vaping Use: Never used  Substance Use Topics  . Alcohol use: No  . Drug use: No     Allergies   Nsaids and Plavix [clopidogrel bisulfate]   Review of Systems Review of Systems  Constitutional: Negative for activity change, appetite change,  chills, fatigue and fever.  Cardiovascular: Negative for chest pain and leg swelling.  Musculoskeletal: Positive for arthralgias, gait problem and joint swelling.  Skin: Negative for color change, rash and wound.  Allergic/Immunologic: Negative for environmental allergies and food allergies.  Neurological: Negative for dizziness, syncope, weakness, light-headedness and numbness.  Hematological: Negative for adenopathy. Bruises/bleeds easily.     Physical Exam Triage Vital Signs ED Triage Vitals  Enc Vitals Group     BP 01/20/20 1705 (!) 162/102     Pulse Rate 01/20/20 1705 84     Resp 01/20/20 1705 18     Temp 01/20/20 1705 98.5 F (36.9 C)     Temp Source 01/20/20 1705 Oral     SpO2 01/20/20 1705 100 %     Weight 01/20/20 1702 233 lb 14.5 oz (106.1 kg)     Height 01/20/20 1702 5\' 7"  (1.702 m)     Head Circumference --      Peak Flow --      Pain Score 01/20/20 1702 6     Pain Loc --      Pain Edu? --      Excl. in [place]? --    No data found.  Updated Vital Signs BP (!) 148/98 (BP Location: Right Arm)   Pulse 84   Temp 98.5 F (36.9 C) (Oral)   Resp 18   Ht 5\' 7"  (1.702 m)   Wt 233 lb 14.5 oz (106.1 kg)   LMP 01/13/2020 (Approximate)   SpO2 100%   BMI 36.64 kg/m   Visual Acuity Right Eye Distance:   Left Eye Distance:   Bilateral Distance:    Right Eye Near:   Left Eye Near:    Bilateral Near:     Physical Exam Vitals and nursing note reviewed.  Constitutional:      General: She is awake. She is not in acute distress.    Appearance: She is well-developed and well-groomed.     Comments: She is sitting comfortably in exam chair in no acute distress.   HENT:     Head: Normocephalic and atraumatic.  Cardiovascular:     Rate and Rhythm: Normal rate.     Pulses:          Posterior tibial pulses are 2+ on the right side and 2+ on the left side.  Pulmonary:     Effort: Pulmonary effort is normal.  Musculoskeletal:        General: Swelling and tenderness  present. Normal range of motion.     Cervical back: Normal range of motion.     Right lower leg: No edema.     Left lower leg: No edema.     Right ankle: Normal.     Right Achilles Tendon: Normal.     Left ankle: Swelling present. No ecchymosis. Tenderness present over the lateral malleolus. Normal range of motion.     Left Achilles  Tendon: Normal.     Right foot: Normal. Normal range of motion.     Left foot: Normal range of motion and normal capillary refill. Swelling and tenderness present. Normal pulse.       Feet:     Comments: Has full range of motion of left foot and ankle but pain with flexion and rotation. Swelling present at left lateral malleolus which extends through lateral and dorsal aspect of foot to base of toes. No distinct bruising or ecchymosis present. Tender to palpation at lateral malleolus and along mid-dorsal area of foot. Good pulses and capillary refill. No neuro deficits noted.   Feet:     Right foot:     Skin integrity: Skin integrity normal.     Left foot:     Skin integrity: Skin integrity normal.  Skin:    General: Skin is warm and dry.     Capillary Refill: Capillary refill takes less than 2 seconds.     Findings: No abrasion, bruising, ecchymosis, erythema or rash.  Neurological:     General: No focal deficit present.     Mental Status: She is alert.     Sensory: Sensation is intact. No sensory deficit.     Motor: Motor function is intact.     Comments: Able to ambulate with pain.   Psychiatric:        Mood and Affect: Mood normal.        Behavior: Behavior normal. Behavior is cooperative.        Thought Content: Thought content normal.        Judgment: Judgment normal.      UC Treatments / Results  Labs (all labs ordered are listed, but only abnormal results are displayed) Labs Reviewed - No data to display  EKG   Radiology DG Ankle Complete Left  Result Date: 01/20/2020 CLINICAL DATA:  Left foot and ankle pain after injury, twisted  foot yesterday when walking. Rolled laterally. EXAM: LEFT ANKLE COMPLETE - 3+ VIEW COMPARISON:  None. FINDINGS: There is no evidence of fracture, dislocation, or joint effusion. Plantar calcaneal spur and Achilles tendon enthesophyte. Alignment and joint spaces are maintained. Ankle mortise is preserved. Mild soft tissue edema. IMPRESSION: Soft tissue edema without acute fracture or subluxation. Electronically Signed   By: Keith Rake M.D.   On: 01/20/2020 19:05   DG Foot Complete Left  Result Date: 01/20/2020 CLINICAL DATA:  Left dorsal foot and ankle pain with swelling after twisting foot fall walking yesterday EXAM: LEFT FOOT - COMPLETE 3+ VIEW COMPARISON:  None FINDINGS: There is no evidence of fracture or dislocation. There is no evidence of arthropathy or other focal bone abnormality. Plantar and posterior calcaneal heel spurs noted. Soft tissues are unremarkable. IMPRESSION: 1. No acute findings. 2. Heel spurs. Electronically Signed   By: Kerby Moors M.D.   On: 01/20/2020 19:04    Procedures Procedures (including critical care time)  Medications Ordered in UC Medications - No data to display  Initial Impression / Assessment and Plan / UC Course  I have reviewed the triage vital signs and the nursing notes.  Pertinent labs & imaging results that were available during my care of the patient were reviewed by me and considered in my medical decision making (see chart for details).    Reviewed x-ray results with patient- no distinct fracture or dislocation. Some edema present in ankle area and heel spurs noted. Discussed that she probably sprained her ankle and foot. Recommend take Tylenol 1000mg  every  8 hours as needed for pain. Try to elevate foot as much as possible. Wear left ankle brace for support. Continue to monitor blood pressure and follow-up with her PCP if BP remains elevated.  Follow-up with her Orthopedic in 4 to 5 days if not improving.   Final Clinical Impressions(s) /  UC Diagnoses   Final diagnoses:  Acute foot pain, left  Acute left ankle pain  Elevated blood pressure reading in office with diagnosis of hypertension     Discharge Instructions     Recommend take Tylenol 1000mg  every 8 hours as needed for pain. Try to keep foot elevated as much as possible. Wear left ankle brace for support- may take off at night. If pain and swelling does not improve within 5 days, follow-up with your Orthopedic for further evaluation.     ED Prescriptions    None     PDMP not reviewed this encounter.   Katy Apo, NP 01/21/20 1248

## 2020-01-20 NOTE — Discharge Instructions (Addendum)
Recommend take Tylenol 1000mg  every 8 hours as needed for pain. Try to keep foot elevated as much as possible. Wear left ankle brace for support- may take off at night. If pain and swelling does not improve within 5 days, follow-up with your Orthopedic for further evaluation.

## 2020-01-20 NOTE — ED Triage Notes (Signed)
Pt c/o left foot pain. Started last night she states she was walking ans she turned her foot over. She states she has injury a while back where a large ketchup bottle fell on her foot.

## 2020-03-30 DIAGNOSIS — F411 Generalized anxiety disorder: Secondary | ICD-10-CM

## 2020-04-01 ENCOUNTER — Telehealth: Payer: Self-pay | Admitting: *Deleted

## 2020-04-01 NOTE — Telephone Encounter (Signed)
Patient called.  Please call patient back at [phone].

## 2020-04-01 NOTE — Telephone Encounter (Signed)
Patient left a voicemail stating that she has been corresponding with Allie Bossier NP thru my chart about her heavy bleeding. Patient stated that she tried to get in this week but no appointments were available with Anda Kraft. Patient stated that she is having heavy bleeding and feeling real tired. Patient stated that she is not dizzy or having any heart palpitations or anything like that. Patient that that she is bleeding heavily but the clotting has gotten better. Patient stated that she is not sure if she should go to the ER. Patient wants to know if she should try taking some iron supplements? Patient has scheduled an appointment with you on 04/12/20. Please advise.

## 2020-04-01 NOTE — Telephone Encounter (Signed)
Patient says the clotting is better.  She is changing an overnight bad every 2 to 4 hours or so and every time she stands up she feels a gush. Does not have a gyn.  Advised patient to take ferrous sulfate 325mg  daily as directed.  Patient will be out of town and cannot come in sooner.

## 2020-04-01 NOTE — Telephone Encounter (Signed)
Noted  

## 2020-04-01 NOTE — Telephone Encounter (Signed)
Please thank her for the update, yes start ferrous sulfate 325 mg daily for now. Can we not get her in any sooner than 09/14?

## 2020-04-12 ENCOUNTER — Ambulatory Visit: Payer: Managed Care, Other (non HMO) | Admitting: Primary Care

## 2020-04-29 ENCOUNTER — Encounter: Payer: Self-pay | Admitting: Primary Care

## 2020-04-29 ENCOUNTER — Ambulatory Visit (INDEPENDENT_AMBULATORY_CARE_PROVIDER_SITE_OTHER): Payer: Managed Care, Other (non HMO) | Admitting: Primary Care

## 2020-04-29 ENCOUNTER — Other Ambulatory Visit: Payer: Self-pay

## 2020-04-29 VITALS — BP 124/68 | Temp 97.7°F | Ht 67.0 in | Wt 248.8 lb

## 2020-04-29 DIAGNOSIS — Z1159 Encounter for screening for other viral diseases: Secondary | ICD-10-CM

## 2020-04-29 DIAGNOSIS — N939 Abnormal uterine and vaginal bleeding, unspecified: Secondary | ICD-10-CM | POA: Diagnosis not present

## 2020-04-29 DIAGNOSIS — E282 Polycystic ovarian syndrome: Secondary | ICD-10-CM

## 2020-04-29 DIAGNOSIS — F411 Generalized anxiety disorder: Secondary | ICD-10-CM

## 2020-04-29 HISTORY — DX: Abnormal uterine and vaginal bleeding, unspecified: N93.9

## 2020-04-29 LAB — CBC
HCT: 35.2 % — ABNORMAL LOW (ref 36.0–46.0)
Hemoglobin: 11.3 g/dL — ABNORMAL LOW (ref 12.0–15.0)
MCHC: 32.2 g/dL (ref 30.0–36.0)
MCV: 85.3 fl (ref 78.0–100.0)
Platelets: 225 10*3/uL (ref 150.0–400.0)
RBC: 4.13 Mil/uL (ref 3.87–5.11)
RDW: 20.1 % — ABNORMAL HIGH (ref 11.5–15.5)
WBC: 5.6 10*3/uL (ref 4.0–10.5)

## 2020-04-29 LAB — IBC + FERRITIN
Ferritin: 15.1 ng/mL (ref 10.0–291.0)
Iron: 41 ug/dL — ABNORMAL LOW (ref 42–145)
Saturation Ratios: 8 % — ABNORMAL LOW (ref 20.0–50.0)
Transferrin: 364 mg/dL — ABNORMAL HIGH (ref 212.0–360.0)

## 2020-04-29 LAB — HEMOGLOBIN A1C: Hgb A1c MFr Bld: 5.1 % (ref 4.6–6.5)

## 2020-04-29 LAB — TSH: TSH: 1.12 u[IU]/mL (ref 0.35–4.50)

## 2020-04-29 MED ORDER — BUPROPION HCL ER (XL) 300 MG PO TB24: 300.0000 mg | ORAL_TABLET | Freq: Every day | ORAL | 0 refills | Status: DC

## 2020-04-29 MED ORDER — FLUOXETINE HCL 10 MG PO CAPS: 10.0000 mg | ORAL_CAPSULE | Freq: Every day | ORAL | 0 refills | Status: DC

## 2020-04-29 NOTE — Progress Notes (Signed)
Subjective:    Patient ID: Jessica Hampton, female    DOB: [birth year], 49 y.o.   MRN: [id number]  HPI  This visit occurred during the SARS-CoV-2 public health emergency.  Safety protocols were in place, including screening questions prior to the visit, additional usage of staff PPE, and extensive cleaning of exam room while observing appropriate contact time as indicated for disinfecting solutions.   Ms. Germani is a 49 year old female with a history of hypertensive urgency, hypertension, CVA, GAD, PCOS who presents today to discuss abnormal uterine bleeding. She would also like to come off of her fluoxetine as she's feeling well.   History of menorrhagia during her younger years, no problems recently until late August. She wasn't due for her menstrual cycle, but two weeks ago she began to have heavy vaginal bleeding about 24 hours after she was provided with a cortisone injection to her foot per podiatry.  Her bleeding was heavy, also passing large clots, soaking numerous pads daily. Her bleeding lasted for 8 days total.   Her last menstrual cycle began on 04/14/20 which lasted for about one week, it was uncomplicated and normal for her. She notified us of her bleeding several weeks ago, and we advised for her to start oral iron daily. She has continued this.   She has a personal history of PCOS and endometriosis. Denies family history of uterine/cervical/ovarian cancer.    BP Readings from Last 3 Encounters:  04/29/20 140/78  01/20/20 (!) 148/98  11/24/19 113/79     Review of Systems  Gastrointestinal: Negative for abdominal pain.  Genitourinary: Positive for vaginal bleeding. Negative for dysuria and pelvic pain.       Past Medical History:  Diagnosis Date  . Acute CVA (cerebrovascular accident) ([place]) 10/04/2019  . Anemia   . Complication of anesthesia   . Family history of adverse reaction to anesthesia    MOM NAUSEATED  . GERD (gastroesophageal reflux disease)    OCC  .  Hypertension   . PONV (postoperative nausea and vomiting)    NAUSEATED     Social History   Socioeconomic History  . Marital status: Married    Spouse name: Not on file  . Number of children: Not on file  . Years of education: Not on file  . Highest education level: Not on file  Occupational History  . Occupation: Soil scientist  Tobacco Use  . Smoking status: Never Smoker  . Smokeless tobacco: Never Used  Vaping Use  . Vaping Use: Never used  Substance and Sexual Activity  . Alcohol use: No  . Drug use: No  . Sexual activity: Not on file  Other Topics Concern  . Not on file  Social History Narrative   Lives with husband   Social Determinants of Health   Financial Resource Strain:   . Difficulty of Paying Living Expenses: Not on file  Food Insecurity:   . Worried About Charity fundraiser in the Last Year: Not on file  . Ran Out of Food in the Last Year: Not on file  Transportation Needs:   . Lack of Transportation (Medical): Not on file  . Lack of Transportation (Non-Medical): Not on file  Physical Activity:   . Days of Exercise per Week: Not on file  . Minutes of Exercise per Session: Not on file  Stress:   . Feeling of Stress : Not on file  Social Connections:   . Frequency of Communication with Friends and Family: Not  on file  . Frequency of Social Gatherings with Friends and Family: Not on file  . Attends Religious Services: Not on file  . Active Member of Clubs or Organizations: Not on file  . Attends Archivist Meetings: Not on file  . Marital Status: Not on file  Intimate Partner Violence:   . Fear of Current or Ex-Partner: Not on file  . Emotionally Abused: Not on file  . Physically Abused: Not on file  . Sexually Abused: Not on file    Past Surgical History:  Procedure Laterality Date  . LAPAROSCOPIC GASTRIC SLEEVE RESECTION WITH HIATAL HERNIA REPAIR N/A 10/02/2016   Procedure: LAPAROSCOPIC GASTRIC SLEEVE RESECTION WITH HIATAL HERNIA  REPAIR;  Surgeon: Bonner Puna, MD;  Location: [facility];  Service: General;  Laterality: N/A;  . LOOP RECORDER INSERTION N/A 11/19/2019   Procedure: LOOP RECORDER INSERTION;  Surgeon: Isaias Cowman, MD;  Location: [place] [facility];  Service: Cardiovascular;  Laterality: N/A;  . TEE WITHOUT CARDIOVERSION N/A 11/24/2019   Procedure: TRANSESOPHAGEAL ECHOCARDIOGRAM (TEE);  Surgeon: Teodoro Spray, MD;  Location: [facility];  Service: Cardiovascular;  Laterality: N/A;  . TUBAL LIGATION  2004    Family History  Problem Relation Age of Onset  . Hypertension Mother   . Hyperlipidemia Father   . Hypertension Father     Allergies  Allergen Reactions  . Nsaids Other (See Comments)    History of gastric bypass   . Plavix [Clopidogrel Bisulfate] Nausea Only    Current Outpatient Medications on File Prior to Visit  Medication Sig Dispense Refill  . aspirin EC 325 MG tablet Take 325 mg by mouth daily with lunch.    Marland Kitchen buPROPion (WELLBUTRIN XL) 150 MG 24 hr tablet Take 1 tablet (150 mg total) by mouth daily. (Patient taking differently: Take 150 mg by mouth daily with lunch. ) 90 tablet 3  . Ferrous Sulfate Dried (HIGH POTENCY IRON) 65 MG TABS Take by mouth.    . losartan (COZAAR) 25 MG tablet Take 25 mg by mouth daily.    . Multiple Vitamins-Minerals (BARIATRIC MULTIVITAMINS/IRON PO) Take 1 tablet by mouth daily with lunch.     . rosuvastatin (CRESTOR) 20 MG tablet Take 1 tablet (20 mg total) by mouth every evening. For cholesterol. 90 tablet 3   No current facility-administered medications on file prior to visit.    BP 124/68   Temp 97.7 F (36.5 C) (Temporal)   Ht 5\' 7"  (1.702 m)   Wt 248 lb 12.8 oz (112.9 kg)   BMI 38.97 kg/m    Objective:   Physical Exam Cardiovascular:     Rate and Rhythm: Normal rate and regular rhythm.  Pulmonary:     Effort: Pulmonary effort is normal.     Breath sounds: Normal breath sounds.  Skin:    General: Skin is warm and dry.     Coloration:  Skin is not pale.  Neurological:     Mental Status: She is alert and oriented to person, place, and time.            Assessment & Plan:

## 2020-04-29 NOTE — Patient Instructions (Signed)
We've reduced the dose of your fluoxetine to 10 mg. Take 1 tablet daily for two weeks, then stop.  Stop by the lab prior to leaving today. I will notify you of your results once received.   You will be contacted regarding your ultrasound.  Please let us know if you have not been contacted within one week.  It was a pleasure to see you today!

## 2020-04-29 NOTE — Assessment & Plan Note (Signed)
Feels that anxiety is under good control, would like to try to wean off fluoxetine.  Will wean off by reducing dose to 10 mg for 2 weeks, then stop.  New prescription sent to pharmacy.

## 2020-04-29 NOTE — Assessment & Plan Note (Signed)
Acute episode in late August with 8 days of heavy vaginal bleeding with clots.  No recent episode of this, but does have history of irregular menstrual cycles years ago.  She had a normal cycle 2 weeks ago.  No complications.  Will obtain pelvic and vaginal ultrasound given her history of endometriosis and PCOS.  She will update if abnormal uterine bleeding returns. Checking labs today including iron panel and CBC.

## 2020-05-02 LAB — HEPATITIS C ANTIBODY
Hepatitis C Ab: NONREACTIVE
SIGNAL TO CUT-OFF: 0.01 (ref <1.00)

## 2020-05-13 ENCOUNTER — Ambulatory Visit
Admission: RE | Admit: 2020-05-13 | Discharge: 2020-05-13 | Disposition: A | Discharge status: Home / Self Care | Payer: Managed Care, Other (non HMO) | Source: Ambulatory Visit | Attending: Primary Care | Admitting: Primary Care

## 2020-05-13 ENCOUNTER — Other Ambulatory Visit: Payer: Self-pay

## 2020-05-13 DIAGNOSIS — N939 Abnormal uterine and vaginal bleeding, unspecified: Secondary | ICD-10-CM | POA: Diagnosis not present

## 2020-06-09 LAB — RESULTS CONSOLE HPV: CHL HPV: NEGATIVE

## 2020-06-09 LAB — HM PAP SMEAR

## 2020-06-10 DIAGNOSIS — F411 Generalized anxiety disorder: Secondary | ICD-10-CM

## 2020-06-29 MED ORDER — FLUOXETINE HCL 20 MG PO TABS: 20.0000 mg | ORAL_TABLET | Freq: Every day | ORAL | 3 refills | Status: DC

## 2020-06-29 MED ORDER — BUPROPION HCL ER (XL) 150 MG PO TB24: 150.0000 mg | ORAL_TABLET | Freq: Every day | ORAL | 3 refills | Status: DC

## 2020-06-30 NOTE — H&P (Signed)
Chief Complaint:   Jessica Hampton is a 49 y.o. female presenting with Pre Op Consulting   History of Present Illness: Patient presents for a pre-operative visit regarding a TLH w/ BS. Surgical indications: menorrhagia with irregular cycle, uterine fibroids, hx of endometriosis. Patient has been cleared by her cardiologist for surgery; she has a hx of CVA in 09/2019.  Her modified Caprini score is: 5-7, high risk. She will have Lovenox preoperatively and SCDs throughout the case  Workup: Pap 05/2020: neg/neg  EMBx 05/2020: Scant proliferative endometrium, mixed with minute fragments of benign endocervical glands, mucus, and blood. No hyperplasia or carcinoma in the submitted biopsy sample.  TVUS 04/2020:  Uterus: 15.6 x 6.5 x 6.9 cm = volume: 364 mL. Threefibroids identified. 1)lower uterine segment anteriorly into the left =7.1 cm 2)right posterior fundus measuring up to 3.4 cm. 3)anterior fundus measuring up to 1.9 cm. EndometriumThickness: 10.1 mm. No focal abnormality visualized. RO: 2.3 x 1.6 x 1.9 cm = volume: 36 mL. Normal appearance/no adnexal mass. LO: 2.9 x 3.4 x 2.1 cm = volume: 10.9 mL. Normal appearance/no adnexal mass.  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  Past Surgical History:  has a past surgical history that includes gastric sleeve; Tubal ligation; and Index Finger Mucous Cyst Excision (Right, 02/03/2020). Family History: family history includes  Cancer in her father; Coronary Artery Disease (Blocked arteries around heart) in her father; High blood pressure (Hypertension) in her maternal grandmother, mother, and paternal grandmother; Hyperlipidemia (Elevated cholesterol) in her father; Kidney disease in her father. Social History:  reports that she has never smoked. She has never used smokeless tobacco. She reports that she does not drink alcohol and does not use drugs. OB/GYN History:  OB History    Gravida  1   Para  1   Term  1   Preterm      AB      Living  1     SAB      IAB      Ectopic      Molar      Multiple      Live Births           Allergies: is allergic to clopidogrel bisulfate, ibuprofen, and nsaids (non-steroidal anti-inflammatory drug). Medications: Current Outpatient Medications:  .  aspirin 325 MG tablet, Take 325 mg by mouth once daily, Disp: , Rfl:  .  buPROPion (WELLBUTRIN XL) 300 MG XL tablet, , Disp: , Rfl:  .  ferrous sulfate 325 (65 FE) MG tablet, Take 325 mg by mouth daily with breakfast, Disp: , Rfl:  .  losartan (COZAAR) 25 MG tablet, Take 1 tablet (25 mg total) by mouth once daily, Disp: 30 tablet, Rfl: 11 .  multivitamin capsule, Take 1 capsule by mouth once daily   , Disp: , Rfl:  .  rosuvastatin (CRESTOR) 20 MG tablet, Take 20 mg by mouth once daily, Disp: , Rfl:   Review of Systems: No SOB, no palpitations or chest pain, no new lower extremity edema, no nausea  or vomiting or bowel or bladder complaints. See HPI for gyn specific ROS.   Exam:   BP 122/84   Ht 170.2 cm (5\' 7" )   Wt (!) 113 kg (249 lb 3.2 oz)   LMP 06/02/2020   BMI 39.03 kg/m   Constitutional:  General appearance: Well nourished, well developed female in no acute distress.  Neuro/psych:  Normal mood and affect. No gross motor deficits. Neck:  Supple, normal appearance.  Respiratory:  Normal respiratory effort, no use of accessory muscles Skin:  No visible rashes or external lesions  Impression:   The  primary encounter diagnosis was Excessive or frequent menstruation. Diagnoses of Uterine leiomyoma, unspecified location, Menorrhagia with irregular cycle, and Endometriosis were also pertinent to this visit.  Plan:   1. Menorrhagia, fibroids -Patient returns for a preoperative discussion regarding her plans to proceed with surgical treatment of her menorrhagia with irregular cycle, uterine fibroids, hx of endometriosis by total laparoscopic hysterectomy with bilateral salpingectomy  procedure.  We may perform a cystoscopy to evaluate the urinary tract after the procedure, if surgically indicated for uro tract integrity.   The patient and I discussed the technical aspects of the procedure including the potential for risks and complications.  These include but are not limited to the risk of infection requiring post-operative antibiotics or further procedures.  We talked about the risk of injury to adjacent organs including bladder, bowel, ureter, blood vessels or nerves.  We talked about the need to convert to an open incision.  We talked about the possible need for blood transfusion.  We talked about postop complications such as thromboembolic or cardiopulmonary complications.  All of her questions were answered.  Her preoperative exam was completed and the appropriate consents were signed. She is scheduled to undergo this procedure in the near future.  Specific Peri-operative Considerations:  Patient has a hx of CVA in 09/2019. Her modified Caprini score is: 5-7, high risk. She will have Lovenox preoperatively and SCDs throughout the case.  - Consent: obtained today - Health Maintenance: up to date - Labs: CBC, CMP preoperatively - Studies: EKG, CXR preoperatively - Bowel Preparation: None required - Abx:  Ancef 2g - VTE ppx: SCDs perioperatively - Glucose Protocol: n/a - Beta-blockade: n/a  -Pt cannot take ibuprofen b/c of hx of gastric sleeve, can tolerate iv NSAIDS - lovenox -possible  open, possible cysto - hx of postop nausea - hx of postop constipation   Diagnoses and all orders for this visit:  Excessive or frequent menstruation  Uterine leiomyoma, unspecified location  Menorrhagia with irregular cycle  Endometriosis   Return for Postop check. ~~~~~~~~~~~~~~~~~~~~~~~~~~~~~~~~~~~~~~~~~~~~~~~~~~~~~~~~~~~~ This note is partially written by Priscella Mann, in the presence of and acting as the scribe of Dr. Benjaman Kindler, who has reviewed, edited and added to the note to reflect her best personal medical judgment.  This note was generated in part with voice recognition software and I apologize for any typographical errors that were not detected and corrected.  Sherrie George, MD

## 2020-07-05 ENCOUNTER — Other Ambulatory Visit: Payer: Self-pay

## 2020-07-05 ENCOUNTER — Other Ambulatory Visit
Admission: RE | Admit: 2020-07-05 | Discharge: 2020-07-05 | Disposition: A | Discharge status: Home / Self Care | Payer: Managed Care, Other (non HMO) | Source: Ambulatory Visit | Attending: Obstetrics and Gynecology | Admitting: Obstetrics and Gynecology

## 2020-07-05 HISTORY — DX: Anxiety disorder, unspecified: F41.9

## 2020-07-05 NOTE — Patient Instructions (Signed)
Your procedure is scheduled on: Thursday December 16 , 2021. Report to Day Surgery inside [facility] 2nd floor (stop by Registration desk first). To find out your arrival time please call [phone] between 1PM - 3PM on Wednesday July 13, 2020.  Remember: Instructions that are not followed completely may result in serious medical risk,  up to and including death, or upon the discretion of your surgeon and anesthesiologist your  surgery may need to be rescheduled.     _X__ 1. Do not eat food after midnight the night before your procedure.                 No chewing gum or hard candies. You may drink clear liquids up to 2 hours                 before you are scheduled to arrive for your surgery- DO not drink clear                 liquids within 2 hours of the start of your surgery.                 Clear Liquids include:  water, apple juice without pulp, clear Gatorade, G2 or                  Gatorade Zero (avoid Red/Purple/Blue), Black Coffee or Tea (Do not add                 anything to coffee or tea).  __X__2.   Complete the "Ensure Clear Pre-surgery Clear Carbohydrate Drink" provided to you, 2 hours before arrival. If you are diabetic you will be provided with an alternative drink, Gatorade Zero or G2.  __X__3.  On the morning of surgery brush your teeth with toothpaste and water, you                may rinse your mouth with mouthwash if you wish.  Do not swallow any toothpaste of mouthwash.     _X__ 4.  No Alcohol for 24 hours before or after surgery.   _X__ 5.  Do Not Smoke or use e-cigarettes For 24 Hours Prior to Your Surgery.                 Do not use any chewable tobacco products for at least 6 hours prior to                 Surgery.  _X__  6.  Do not use any recreational drugs (marijuana, cocaine, heroin, ecstasy, MDMA or other)                For at least one week prior to your surgery.  Combination of these drugs with anesthesia                 May have life threatening results.  __X__  7.  Notify your doctor if there is any change in your medical condition      (cold, fever, infections).     Do not wear jewelry, make-up, hairpins, clips or nail polish. Do not wear lotions, powders, or perfumes. You may wear deodorant. Do not shave 48 hours prior to surgery. Men may shave face and neck. Do not bring valuables to the hospital.    [facility] is not responsible for any belongings or valuables.  Contacts, dentures or bridgework may not be worn into surgery. Leave your suitcase in the car. After surgery it may  be brought to your room. For patients admitted to the hospital, discharge time is determined by your treatment team.   Patients discharged the day of surgery will not be allowed to drive home.   Make arrangements for someone to be with you for the first 24 hours of your Same Day Discharge.   __X__ Take these medicines the morning of surgery with A SIP OF WATER:    1. None    ____ Fleet Enema (as directed)   ____ Use CHG Soap (or wipes) as directed  ____ Use Benzoyl Peroxide Gel as instructed  ____ Use inhalers on the day of surgery  ____ Stop metformin 2 days prior to surgery    ____ Take 1/2 of usual insulin dose the night before surgery. No insulin the morning          of surgery.   _x___ Stop aspirin EC 325 MG as instructed by your provider.  _x___ Stop Anti-inflammatories such as Ibuprofen, Aleve, Advil, naproxen and or BC powders.    _x___ Stop supplements until after surgery.    _x___ Do no start any herbal supplements before you procedure.    If you have any questions regarding your pre-procedure instructions,  Please call Pre-admit Testing at [phone].

## 2020-07-12 ENCOUNTER — Other Ambulatory Visit: Payer: Self-pay

## 2020-07-12 ENCOUNTER — Other Ambulatory Visit
Admission: RE | Admit: 2020-07-12 | Discharge: 2020-07-12 | Disposition: A | Discharge status: Home / Self Care | Payer: Managed Care, Other (non HMO) | Source: Ambulatory Visit | Attending: Obstetrics and Gynecology | Admitting: Obstetrics and Gynecology

## 2020-07-12 DIAGNOSIS — Z20822 Contact with and (suspected) exposure to covid-19: Secondary | ICD-10-CM | POA: Insufficient documentation

## 2020-07-12 DIAGNOSIS — Z01812 Encounter for preprocedural laboratory examination: Secondary | ICD-10-CM | POA: Insufficient documentation

## 2020-07-12 LAB — TYPE AND SCREEN
ABO/RH(D): A POS
Antibody Screen: NEGATIVE

## 2020-07-12 LAB — CBC
HCT: 42 % (ref 36.0–46.0)
Hemoglobin: 13.1 g/dL (ref 12.0–15.0)
MCH: 27.4 pg (ref 26.0–34.0)
MCHC: 31.2 g/dL (ref 30.0–36.0)
MCV: 87.9 fL (ref 80.0–100.0)
Platelets: 228 10*3/uL (ref 150–400)
RBC: 4.78 MIL/uL (ref 3.87–5.11)
RDW: 14 % (ref 11.5–15.5)
WBC: 6.3 10*3/uL (ref 4.0–10.5)
nRBC: 0 % (ref 0.0–0.2)

## 2020-07-12 LAB — BASIC METABOLIC PANEL WITH GFR
Anion gap: 9 (ref 5–15)
BUN: 15 mg/dL (ref 6–20)
CO2: 28 mmol/L (ref 22–32)
Calcium: 9 mg/dL (ref 8.9–10.3)
Chloride: 104 mmol/L (ref 98–111)
Creatinine, Ser: 0.57 mg/dL (ref 0.44–1.00)
GFR, Estimated: >60 mL/min (ref >60)
Glucose, Bld: 95 mg/dL (ref 70–99)
Potassium: 3.9 mmol/L (ref 3.5–5.1)
Sodium: 141 mmol/L (ref 135–145)

## 2020-07-12 LAB — SARS CORONAVIRUS 2 (TAT 6-24 HRS): SARS Coronavirus 2: NEGATIVE

## 2020-07-14 ENCOUNTER — Other Ambulatory Visit: Payer: Self-pay

## 2020-07-14 ENCOUNTER — Encounter: Payer: Self-pay | Admitting: Obstetrics and Gynecology

## 2020-07-14 ENCOUNTER — Ambulatory Visit: Payer: Managed Care, Other (non HMO) | Admitting: Anesthesiology

## 2020-07-14 ENCOUNTER — Encounter
Admission: RE | Disposition: A | Discharge status: Home / Self Care | Payer: Self-pay | Source: Home / Self Care | Attending: Obstetrics and Gynecology

## 2020-07-14 ENCOUNTER — Inpatient Hospital Stay
Admission: RE | Admit: 2020-07-14 | Discharge: 2020-07-16 | DRG: 743 | Disposition: A | Discharge status: Home / Self Care | Payer: Managed Care, Other (non HMO) | Attending: Obstetrics and Gynecology | Admitting: Obstetrics and Gynecology

## 2020-07-14 ENCOUNTER — Ambulatory Visit: Payer: Managed Care, Other (non HMO)

## 2020-07-14 DIAGNOSIS — F419 Anxiety disorder, unspecified: Secondary | ICD-10-CM | POA: Diagnosis present

## 2020-07-14 DIAGNOSIS — Z8673 Personal history of transient ischemic attack (TIA), and cerebral infarction without residual deficits: Secondary | ICD-10-CM

## 2020-07-14 DIAGNOSIS — I10 Essential (primary) hypertension: Secondary | ICD-10-CM | POA: Diagnosis present

## 2020-07-14 DIAGNOSIS — Z9884 Bariatric surgery status: Secondary | ICD-10-CM | POA: Diagnosis not present

## 2020-07-14 DIAGNOSIS — R100 Acute abdomen: Secondary | ICD-10-CM

## 2020-07-14 DIAGNOSIS — D251 Intramural leiomyoma of uterus: Secondary | ICD-10-CM | POA: Diagnosis present

## 2020-07-14 DIAGNOSIS — Z20822 Contact with and (suspected) exposure to covid-19: Secondary | ICD-10-CM | POA: Diagnosis present

## 2020-07-14 DIAGNOSIS — Z5331 Laparoscopic surgical procedure converted to open procedure: Secondary | ICD-10-CM | POA: Diagnosis not present

## 2020-07-14 DIAGNOSIS — N921 Excessive and frequent menstruation with irregular cycle: Principal | ICD-10-CM | POA: Diagnosis present

## 2020-07-14 DIAGNOSIS — N803 Endometriosis of pelvic peritoneum: Secondary | ICD-10-CM | POA: Diagnosis present

## 2020-07-14 HISTORY — PX: APPLICATION OF WOUND VAC: SHX5189

## 2020-07-14 HISTORY — PX: ABDOMINAL HYSTERECTOMY: SHX81

## 2020-07-14 HISTORY — PX: CYSTOSCOPY: SHX5120

## 2020-07-14 HISTORY — PX: TOTAL LAPAROSCOPIC HYSTERECTOMY WITH SALPINGECTOMY: SHX6742

## 2020-07-14 HISTORY — DX: Intramural leiomyoma of uterus: D25.1

## 2020-07-14 LAB — PROTIME-INR
INR: 1 (ref 0.8–1.2)
Prothrombin Time: 12.7 seconds (ref 11.4–15.2)

## 2020-07-14 LAB — CBC
HCT: 35.1 % — ABNORMAL LOW (ref 36.0–46.0)
Hemoglobin: 11 g/dL — ABNORMAL LOW (ref 12.0–15.0)
MCH: 27.8 pg (ref 26.0–34.0)
MCHC: 31.3 g/dL (ref 30.0–36.0)
MCV: 88.6 fL (ref 80.0–100.0)
Platelets: 267 10*3/uL (ref 150–400)
RBC: 3.96 MIL/uL (ref 3.87–5.11)
RDW: 13.8 % (ref 11.5–15.5)
WBC: 22.2 10*3/uL — ABNORMAL HIGH (ref 4.0–10.5)
nRBC: 0 % (ref 0.0–0.2)

## 2020-07-14 LAB — FIBRINOGEN: Fibrinogen: 287 mg/dL (ref 210–475)

## 2020-07-14 LAB — POCT PREGNANCY, URINE: Preg Test, Ur: NEGATIVE

## 2020-07-14 SURGERY — HYSTERECTOMY, TOTAL, LAPAROSCOPIC, WITH SALPINGECTOMY
Anesthesia: General

## 2020-07-14 MED ORDER — LACTATED RINGERS IV SOLN: INTRAVENOUS | Status: DC

## 2020-07-14 MED ORDER — PROPOFOL 10 MG/ML IV BOLUS
INTRAVENOUS | Status: DC | PRN
Start: 2020-07-14 — End: 2020-07-14
  Administered 2020-07-14: 160 mg via INTRAVENOUS

## 2020-07-14 MED ORDER — PROPOFOL 10 MG/ML IV BOLUS
INTRAVENOUS | Status: AC
  Filled 2020-07-14: qty 60

## 2020-07-14 MED ORDER — PROPOFOL 10 MG/ML IV BOLUS
INTRAVENOUS | Status: AC
  Filled 2020-07-14: qty 20

## 2020-07-14 MED ORDER — ONDANSETRON HCL 4 MG/2ML IJ SOLN
4.0000 mg | Freq: Once | INTRAMUSCULAR | Status: AC | PRN
  Administered 2020-07-14: 18:00:00 4 mg via INTRAVENOUS

## 2020-07-14 MED ORDER — MIDAZOLAM HCL 2 MG/2ML IJ SOLN
INTRAMUSCULAR | Status: AC
  Filled 2020-07-14: qty 2

## 2020-07-14 MED ORDER — GLYCOPYRROLATE 0.2 MG/ML IJ SOLN
INTRAMUSCULAR | Status: DC | PRN
  Administered 2020-07-14: .2 mg via INTRAVENOUS

## 2020-07-14 MED ORDER — SUGAMMADEX SODIUM 200 MG/2ML IV SOLN
INTRAVENOUS | Status: DC | PRN
  Administered 2020-07-14: 400 mg via INTRAVENOUS

## 2020-07-14 MED ORDER — BUPIVACAINE LIPOSOME 1.3 % IJ SUSP
INTRAMUSCULAR | Status: AC
  Filled 2020-07-14: qty 20

## 2020-07-14 MED ORDER — ORAL CARE MOUTH RINSE: 15.0000 mL | Freq: Once | OROMUCOSAL | Status: AC

## 2020-07-14 MED ORDER — DIPHENHYDRAMINE HCL 12.5 MG/5ML PO ELIX
12.5000 mg | ORAL_SOLUTION | Freq: Four times a day (QID) | ORAL | Status: DC | PRN
  Filled 2020-07-14: qty 5

## 2020-07-14 MED ORDER — PROPOFOL 500 MG/50ML IV EMUL
INTRAVENOUS | Status: DC | PRN
  Administered 2020-07-14: 150 ug/kg/min via INTRAVENOUS

## 2020-07-14 MED ORDER — SODIUM CHLORIDE (PF) 0.9 % IJ SOLN
INTRAMUSCULAR | Status: AC
  Filled 2020-07-14: qty 50

## 2020-07-14 MED ORDER — LIDOCAINE HCL (CARDIAC) PF 100 MG/5ML IV SOSY
PREFILLED_SYRINGE | INTRAVENOUS | Status: DC | PRN
  Administered 2020-07-14: 50 mg via INTRAVENOUS

## 2020-07-14 MED ORDER — FENTANYL CITRATE (PF) 100 MCG/2ML IJ SOLN
INTRAMUSCULAR | Status: DC | PRN
  Administered 2020-07-14: 25 ug via INTRAVENOUS
  Administered 2020-07-14 (×3): 50 ug via INTRAVENOUS
  Administered 2020-07-14: 100 ug via INTRAVENOUS
  Administered 2020-07-14: 25 ug via INTRAVENOUS

## 2020-07-14 MED ORDER — ACETAMINOPHEN 500 MG PO TABS
ORAL_TABLET | ORAL | Status: AC
  Administered 2020-07-14: 11:00:00 1000 mg via ORAL
  Filled 2020-07-14: qty 2

## 2020-07-14 MED ORDER — SCOPOLAMINE 1 MG/3DAYS TD PT72: 1.0000 | MEDICATED_PATCH | TRANSDERMAL | Status: DC

## 2020-07-14 MED ORDER — PROPOFOL 500 MG/50ML IV EMUL
INTRAVENOUS | Status: AC
  Filled 2020-07-14: qty 50

## 2020-07-14 MED ORDER — ONDANSETRON HCL 4 MG/2ML IJ SOLN
INTRAMUSCULAR | Status: DC | PRN
  Administered 2020-07-14: 4 mg via INTRAVENOUS

## 2020-07-14 MED ORDER — FENTANYL CITRATE (PF) 100 MCG/2ML IJ SOLN
INTRAMUSCULAR | Status: AC
  Filled 2020-07-14: qty 2

## 2020-07-14 MED ORDER — DOCUSATE SODIUM 100 MG PO CAPS
100.0000 mg | ORAL_CAPSULE | Freq: Two times a day (BID) | ORAL | Status: DC
  Administered 2020-07-14 – 2020-07-16 (×4): 100 mg via ORAL
  Filled 2020-07-14 (×4): qty 1

## 2020-07-14 MED ORDER — ALUM & MAG HYDROXIDE-SIMETH 200-200-20 MG/5ML PO SUSP
30.0000 mL | ORAL | Status: DC | PRN
  Administered 2020-07-15: 11:00:00 30 mL via ORAL
  Filled 2020-07-14: qty 30

## 2020-07-14 MED ORDER — CEFAZOLIN SODIUM-DEXTROSE 2-4 GM/100ML-% IV SOLN
INTRAVENOUS | Status: AC
  Filled 2020-07-14: qty 100

## 2020-07-14 MED ORDER — GLYCOPYRROLATE 0.2 MG/ML IJ SOLN
INTRAMUSCULAR | Status: AC
  Filled 2020-07-14: qty 1

## 2020-07-14 MED ORDER — FAMOTIDINE 20 MG PO TABS
ORAL_TABLET | ORAL | Status: AC
  Administered 2020-07-14: 12:00:00 20 mg via ORAL
  Filled 2020-07-14: qty 1

## 2020-07-14 MED ORDER — LOSARTAN POTASSIUM 25 MG PO TABS
25.0000 mg | ORAL_TABLET | Freq: Every day | ORAL | Status: DC
  Administered 2020-07-14: 22:00:00 25 mg via ORAL
  Filled 2020-07-14 (×4): qty 1

## 2020-07-14 MED ORDER — ONDANSETRON HCL 4 MG/2ML IJ SOLN
INTRAMUSCULAR | Status: AC
  Filled 2020-07-14: qty 2

## 2020-07-14 MED ORDER — ENOXAPARIN SODIUM 40 MG/0.4ML ~~LOC~~ SOLN: 40.0000 mg | SUBCUTANEOUS | Status: AC

## 2020-07-14 MED ORDER — FLUOXETINE HCL 20 MG PO CAPS
20.0000 mg | ORAL_CAPSULE | Freq: Every day | ORAL | Status: DC
  Administered 2020-07-14 – 2020-07-15 (×2): 20 mg via ORAL
  Filled 2020-07-14: qty 2
  Filled 2020-07-14 (×2): qty 1
  Filled 2020-07-14: qty 2
  Filled 2020-07-14 (×2): qty 1

## 2020-07-14 MED ORDER — ACETAMINOPHEN 10 MG/ML IV SOLN
INTRAVENOUS | Status: DC | PRN
  Administered 2020-07-14: 1000 mg via INTRAVENOUS

## 2020-07-14 MED ORDER — MIDAZOLAM HCL 2 MG/2ML IJ SOLN
INTRAMUSCULAR | Status: DC | PRN
  Administered 2020-07-14: 2 mg via INTRAVENOUS

## 2020-07-14 MED ORDER — FAMOTIDINE 20 MG PO TABS: 20.0000 mg | ORAL_TABLET | Freq: Once | ORAL | Status: AC

## 2020-07-14 MED ORDER — ACETAMINOPHEN 500 MG PO TABS: 1000.0000 mg | ORAL_TABLET | ORAL | Status: AC

## 2020-07-14 MED ORDER — ONDANSETRON HCL 4 MG/2ML IJ SOLN: 4.0000 mg | Freq: Four times a day (QID) | INTRAMUSCULAR | Status: DC | PRN

## 2020-07-14 MED ORDER — GABAPENTIN 300 MG PO CAPS
ORAL_CAPSULE | ORAL | Status: AC
  Administered 2020-07-14: 12:00:00 300 mg via ORAL
  Filled 2020-07-14: qty 1

## 2020-07-14 MED ORDER — SUCCINYLCHOLINE CHLORIDE 20 MG/ML IJ SOLN
INTRAMUSCULAR | Status: DC | PRN
  Administered 2020-07-14: 40 mg via INTRAVENOUS
  Administered 2020-07-14: 100 mg via INTRAVENOUS

## 2020-07-14 MED ORDER — FENTANYL CITRATE (PF) 100 MCG/2ML IJ SOLN
25.0000 ug | INTRAMUSCULAR | Status: DC | PRN
  Administered 2020-07-14: 25 ug via INTRAVENOUS

## 2020-07-14 MED ORDER — BUPIVACAINE HCL (PF) 0.5 % IJ SOLN
INTRAMUSCULAR | Status: AC
  Filled 2020-07-14: qty 30

## 2020-07-14 MED ORDER — DIPHENHYDRAMINE HCL 50 MG/ML IJ SOLN: 12.5000 mg | Freq: Four times a day (QID) | INTRAMUSCULAR | Status: DC | PRN

## 2020-07-14 MED ORDER — CHLORHEXIDINE GLUCONATE 0.12 % MT SOLN: 15.0000 mL | Freq: Once | OROMUCOSAL | Status: AC

## 2020-07-14 MED ORDER — POVIDONE-IODINE 10 % EX SWAB: 2.0000 "application " | Freq: Once | CUTANEOUS | Status: DC

## 2020-07-14 MED ORDER — NALOXONE HCL 0.4 MG/ML IJ SOLN: 0.4000 mg | INTRAMUSCULAR | Status: DC | PRN

## 2020-07-14 MED ORDER — SCOPOLAMINE 1 MG/3DAYS TD PT72
MEDICATED_PATCH | TRANSDERMAL | Status: AC
  Administered 2020-07-14: 11:00:00 1.5 mg via TRANSDERMAL
  Filled 2020-07-14: qty 1

## 2020-07-14 MED ORDER — CEFAZOLIN SODIUM-DEXTROSE 2-4 GM/100ML-% IV SOLN
2.0000 g | INTRAVENOUS | Status: AC
  Administered 2020-07-14: 12:00:00 2 g via INTRAVENOUS
  Administered 2020-07-14: 16:00:00 3 g via INTRAVENOUS
  Administered 2020-07-14: 13:00:00 1 g via INTRAVENOUS

## 2020-07-14 MED ORDER — CHLORHEXIDINE GLUCONATE 0.12 % MT SOLN
OROMUCOSAL | Status: AC
  Administered 2020-07-14: 12:00:00 15 mL via OROMUCOSAL
  Filled 2020-07-14: qty 15

## 2020-07-14 MED ORDER — ACETAMINOPHEN 10 MG/ML IV SOLN
INTRAVENOUS | Status: AC
  Filled 2020-07-14: qty 100

## 2020-07-14 MED ORDER — BUPIVACAINE HCL 0.5 % IJ SOLN
INTRAMUSCULAR | Status: DC | PRN
  Administered 2020-07-14: 37 mL

## 2020-07-14 MED ORDER — GABAPENTIN 300 MG PO CAPS: 300.0000 mg | ORAL_CAPSULE | ORAL | Status: AC

## 2020-07-14 MED ORDER — ROSUVASTATIN CALCIUM 20 MG PO TABS
20.0000 mg | ORAL_TABLET | Freq: Every evening | ORAL | Status: DC
  Administered 2020-07-14 – 2020-07-15 (×2): 20 mg via ORAL
  Filled 2020-07-14 (×4): qty 1

## 2020-07-14 MED ORDER — BUPROPION HCL ER (XL) 150 MG PO TB24
150.0000 mg | ORAL_TABLET | Freq: Every day | ORAL | Status: DC
  Administered 2020-07-14 – 2020-07-15 (×2): 150 mg via ORAL
  Filled 2020-07-14 (×4): qty 1

## 2020-07-14 MED ORDER — MENTHOL 3 MG MT LOZG
1.0000 | LOZENGE | OROMUCOSAL | Status: DC | PRN
  Filled 2020-07-14: qty 9

## 2020-07-14 MED ORDER — HYDROMORPHONE HCL 1 MG/ML IJ SOLN
0.5000 mg | Freq: Once | INTRAMUSCULAR | Status: AC
Start: 2020-07-14 — End: 2020-07-14
  Administered 2020-07-14: 21:00:00 0.5 mg via INTRAVENOUS
  Filled 2020-07-14: qty 1

## 2020-07-14 MED ORDER — GABAPENTIN 300 MG PO CAPS
300.0000 mg | ORAL_CAPSULE | Freq: Every day | ORAL | Status: DC
  Administered 2020-07-14 – 2020-07-15 (×2): 300 mg via ORAL
  Filled 2020-07-14 (×2): qty 1

## 2020-07-14 MED ORDER — OXYCODONE HCL 5 MG PO TABS: 5.0000 mg | ORAL_TABLET | Freq: Once | ORAL | Status: DC | PRN

## 2020-07-14 MED ORDER — ACETAMINOPHEN 10 MG/ML IV SOLN: 1000.0000 mg | Freq: Once | INTRAVENOUS | Status: DC | PRN

## 2020-07-14 MED ORDER — DEXAMETHASONE SODIUM PHOSPHATE 10 MG/ML IJ SOLN
INTRAMUSCULAR | Status: DC | PRN
  Administered 2020-07-14: 10 mg via INTRAVENOUS

## 2020-07-14 MED ORDER — FERROUS SULFATE 325 (65 FE) MG PO TABS
325.0000 mg | ORAL_TABLET | Freq: Every day | ORAL | Status: DC
  Administered 2020-07-15 – 2020-07-16 (×2): 325 mg via ORAL
  Filled 2020-07-14 (×2): qty 1

## 2020-07-14 MED ORDER — SODIUM CHLORIDE 0.9 % IV SOLN
INTRAVENOUS | Status: DC | PRN
  Administered 2020-07-14: 18:00:00 70 mL

## 2020-07-14 MED ORDER — HYDROMORPHONE 1 MG/ML IV SOLN
INTRAVENOUS | Status: DC
Start: 2020-07-14 — End: 2020-07-15
  Administered 2020-07-14: 0 mg via INTRAVENOUS
  Administered 2020-07-14: 25 mg via INTRAVENOUS
  Administered 2020-07-15: 0 mg via INTRAVENOUS
  Filled 2020-07-14 (×2): qty 30

## 2020-07-14 MED ORDER — PHENYLEPHRINE HCL (PRESSORS) 10 MG/ML IV SOLN
INTRAVENOUS | Status: DC | PRN
  Administered 2020-07-14 (×2): 100 ug via INTRAVENOUS

## 2020-07-14 MED ORDER — SODIUM CHLORIDE 0.9% FLUSH: 9.0000 mL | INTRAVENOUS | Status: DC | PRN

## 2020-07-14 MED ORDER — OCUVITE-LUTEIN PO CAPS
ORAL_CAPSULE | Freq: Every day | ORAL | Status: DC
  Filled 2020-07-14 (×2): qty 1

## 2020-07-14 MED ORDER — LIDOCAINE HCL (PF) 2 % IJ SOLN
INTRAMUSCULAR | Status: AC
  Filled 2020-07-14: qty 5

## 2020-07-14 MED ORDER — OXYCODONE HCL 5 MG/5ML PO SOLN: 5.0000 mg | Freq: Once | ORAL | Status: DC | PRN

## 2020-07-14 MED ORDER — ROCURONIUM BROMIDE 100 MG/10ML IV SOLN
INTRAVENOUS | Status: DC | PRN
  Administered 2020-07-14: 35 mg via INTRAVENOUS
  Administered 2020-07-14: 10 mg via INTRAVENOUS
  Administered 2020-07-14: 5 mg via INTRAVENOUS
  Administered 2020-07-14: 20 mg via INTRAVENOUS
  Administered 2020-07-14: 10 mg via INTRAVENOUS
  Administered 2020-07-14: 20 mg via INTRAVENOUS
  Administered 2020-07-14: 10 mg via INTRAVENOUS

## 2020-07-14 MED ORDER — ENOXAPARIN SODIUM 40 MG/0.4ML ~~LOC~~ SOLN
SUBCUTANEOUS | Status: AC
  Administered 2020-07-14: 11:00:00 40 mg via SUBCUTANEOUS
  Filled 2020-07-14: qty 0.4

## 2020-07-14 MED ORDER — EPHEDRINE SULFATE 50 MG/ML IJ SOLN
INTRAMUSCULAR | Status: DC | PRN
  Administered 2020-07-14: 5 mg via INTRAVENOUS

## 2020-07-14 SURGICAL SUPPLY — 90 items
APPLICATOR ARISTA FLEXITIP XL (MISCELLANEOUS) ×5 IMPLANT
BAG URINE DRAIN 2000ML AR STRL (UROLOGICAL SUPPLIES) ×10 IMPLANT
BLADE SURG SZ11 CARB STEEL (BLADE) ×5 IMPLANT
CANISTER SUCT 1200ML W/VALVE (MISCELLANEOUS) ×5 IMPLANT
CANISTER WOUND CARE 500ML ATS (WOUND CARE) ×5 IMPLANT
CATH FOL 2WAY LX 16X5 (CATHETERS) ×5 IMPLANT
CATH FOLEY 2WAY  5CC 16FR (CATHETERS) ×5
CATH ROBINSON RED A/P 16FR (CATHETERS) ×5 IMPLANT
CATH URTH 16FR FL 2W BLN LF (CATHETERS) ×3 IMPLANT
CHLORAPREP W/TINT 26 (MISCELLANEOUS) ×5 IMPLANT
CLOSURE WOUND 1/4X4 (GAUZE/BANDAGES/DRESSINGS) ×1
CORD MONOPOLAR M/FML 12FT (MISCELLANEOUS) ×5 IMPLANT
COVER WAND RF STERILE (DRAPES) ×5 IMPLANT
DERMABOND ADVANCED (GAUZE/BANDAGES/DRESSINGS) ×2
DERMABOND ADVANCED .7 DNX12 (GAUZE/BANDAGES/DRESSINGS) ×3 IMPLANT
DRAPE GENERAL ENDO 106X123.5 (DRAPES) ×5 IMPLANT
DRAPE LAP W/FLUID (DRAPES) ×5 IMPLANT
DRAPE STERI POUCH LG 24X46 STR (DRAPES) IMPLANT
DRAPE UNDER BUTTOCK W/FLU (DRAPES) ×5 IMPLANT
DRSG TELFA 3X8 NADH (GAUZE/BANDAGES/DRESSINGS) ×5 IMPLANT
ELECT CAUTERY BLADE 6.4 (BLADE) ×5 IMPLANT
ELECT REM PT RETURN 9FT ADLT (ELECTROSURGICAL) ×5
ELECTRODE REM PT RTRN 9FT ADLT (ELECTROSURGICAL) ×3 IMPLANT
GAUZE 4X4 16PLY RFD (DISPOSABLE) ×5 IMPLANT
GAUZE SPONGE 4X4 12PLY STRL (GAUZE/BANDAGES/DRESSINGS) ×5 IMPLANT
GLOVE BIO SURGEON STRL SZ7 (GLOVE) ×10 IMPLANT
GLOVE INDICATOR 7.5 STRL GRN (GLOVE) ×5 IMPLANT
GOWN STRL REUS W/ TWL LRG LVL3 (GOWN DISPOSABLE) ×6 IMPLANT
GOWN STRL REUS W/ TWL XL LVL3 (GOWN DISPOSABLE) ×3 IMPLANT
GOWN STRL REUS W/TWL LRG LVL3 (GOWN DISPOSABLE) ×10
GOWN STRL REUS W/TWL XL LVL3 (GOWN DISPOSABLE) ×5
GRASPER SUT TROCAR 14GX15 (MISCELLANEOUS) ×5 IMPLANT
HANDLE YANKAUER SUCT BULB TIP (MISCELLANEOUS) ×5 IMPLANT
HEMOSTAT ARISTA ABSORB 3G PWDR (HEMOSTASIS) IMPLANT
IRRIGATION STRYKERFLOW (MISCELLANEOUS) ×3 IMPLANT
IRRIGATOR STRYKERFLOW (MISCELLANEOUS) ×5
IV LACTATED RINGERS 1000ML (IV SOLUTION) ×5 IMPLANT
IV NS 1000ML (IV SOLUTION) ×5
IV NS 1000ML BAXH (IV SOLUTION) ×3 IMPLANT
KIT PINK PAD W/HEAD ARE REST (MISCELLANEOUS) ×5
KIT PINK PAD W/HEAD ARM REST (MISCELLANEOUS) ×3 IMPLANT
KIT PREVENA INCISION MGT20CM45 (CANNISTER) ×5 IMPLANT
KIT TURNOVER CYSTO (KITS) ×5 IMPLANT
L-HOOK LAP DISP 36CM (ELECTROSURGICAL)
LABEL OR SOLS (LABEL) ×5 IMPLANT
LHOOK LAP DISP 36CM (ELECTROSURGICAL) IMPLANT
LIGASURE VESSEL 5MM BLUNT TIP (ELECTROSURGICAL) IMPLANT
MANIFOLD NEPTUNE II (INSTRUMENTS) ×5 IMPLANT
MANIPULATOR VCARE STD CRV RETR (MISCELLANEOUS) ×5 IMPLANT
NEEDLE FILTER BLUNT 18X 1/2SAF (NEEDLE) ×2
NEEDLE FILTER BLUNT 18X1 1/2 (NEEDLE) ×3 IMPLANT
NS IRRIG 500ML POUR BTL (IV SOLUTION) ×5 IMPLANT
PACK BASIN MAJOR ARMC (MISCELLANEOUS) ×5 IMPLANT
PACK GYN LAPAROSCOPIC (MISCELLANEOUS) ×5 IMPLANT
PAD OB MATERNITY 4.3X12.25 (PERSONAL CARE ITEMS) ×5 IMPLANT
PAD PREP 24X41 OB/GYN DISP (PERSONAL CARE ITEMS) ×5 IMPLANT
PENCIL ELECTRO HAND CTR (MISCELLANEOUS) IMPLANT
POUCH SPECIMEN RETRIEVAL 10MM (ENDOMECHANICALS) IMPLANT
SCISSORS METZENBAUM CVD 33 (INSTRUMENTS) IMPLANT
SET CYSTO W/LG BORE CLAMP LF (SET/KITS/TRAYS/PACK) ×5 IMPLANT
SET TUBE SMOKE EVAC HIGH FLOW (TUBING) ×10 IMPLANT
SLEEVE ENDOPATH XCEL 5M (ENDOMECHANICALS) ×5 IMPLANT
SOL PREP PVP 2OZ (MISCELLANEOUS) ×5
SOLUTION PREP PVP 2OZ (MISCELLANEOUS) ×3 IMPLANT
SPONGE LAP 18X18 RF (DISPOSABLE) ×5 IMPLANT
STAPLER INSORB 30 2030 C-SECTI (MISCELLANEOUS) ×5 IMPLANT
STAPLER SKIN PROX 35W (STAPLE) ×5 IMPLANT
STRIP CLOSURE SKIN 1/4X4 (GAUZE/BANDAGES/DRESSINGS) ×4 IMPLANT
SURGILUBE 2OZ TUBE FLIPTOP (MISCELLANEOUS) ×5 IMPLANT
SUT CHROMIC 2 0 SH (SUTURE) ×5 IMPLANT
SUT MNCRL AB 4-0 PS2 18 (SUTURE) ×10 IMPLANT
SUT PDS AB 1 TP1 96 (SUTURE) ×10 IMPLANT
SUT VIC AB 0 CT1 27 (SUTURE) ×15
SUT VIC AB 0 CT1 27XCR 8 STRN (SUTURE) ×9 IMPLANT
SUT VIC AB 0 CT1 36 (SUTURE) ×10 IMPLANT
SUT VIC AB 2-0 SH 27 (SUTURE) ×80
SUT VIC AB 2-0 SH 27XBRD (SUTURE) ×48 IMPLANT
SUT VIC AB 2-0 UR6 27 (SUTURE) ×5 IMPLANT
SUT VIC AB 4-0 SH 27 (SUTURE) ×5
SUT VIC AB 4-0 SH 27XANBCTRL (SUTURE) ×3 IMPLANT
SUT VICRYL 0 UR6 27IN ABS (SUTURE) ×10 IMPLANT
SUT VICRYL PLUS ABS 0 54 (SUTURE) ×5 IMPLANT
SYR 50ML LL SCALE MARK (SYRINGE) IMPLANT
SYR 5ML LL (SYRINGE) ×5 IMPLANT
SYR BULB IRRIG 60ML STRL (SYRINGE) ×5 IMPLANT
TRAY FOLEY MTR SLVR 16FR STAT (SET/KITS/TRAYS/PACK) ×5 IMPLANT
TROCAR ENDO BLADELESS 11MM (ENDOMECHANICALS) ×5 IMPLANT
TROCAR XCEL NON-BLD 5MMX100MML (ENDOMECHANICALS) ×5 IMPLANT
TUBING ART PRESS 48 MALE/FEM (TUBING) IMPLANT
WATER STERILE IRR 1000ML POUR (IV SOLUTION) ×5 IMPLANT

## 2020-07-14 NOTE — Anesthesia Procedure Notes (Addendum)
Procedure Name: Intubation Date/Time: 07/14/2020 12:19 PM Performed by: Rolla Plate, CRNA Pre-anesthesia Checklist: Patient identified, Patient being monitored, Timeout performed, Emergency Drugs available and Suction available Patient Re-evaluated:Patient Re-evaluated prior to induction Oxygen Delivery Method: Circle system utilized Preoxygenation: Pre-oxygenation with 100% oxygen Induction Type: IV induction Ventilation: Mask ventilation without difficulty Laryngoscope Size: 3 and McGraph Grade View: Grade I Tube type: Oral Tube size: 6.5 mm Number of attempts: 1 Airway Equipment and Method: Stylet Placement Confirmation: ETT inserted through vocal cords under direct vision,  positive ETCO2 and breath sounds checked- equal and bilateral Secured at: 21 cm Tube secured with: Tape Dental Injury: Teeth and Oropharynx as per pre-operative assessment

## 2020-07-14 NOTE — Transfer of Care (Signed)
Immediate Anesthesia Transfer of Care Note  Patient: Jessica Hampton  Procedure(s) Performed: TOTAL LAPAROSCOPIC HYSTERECTOMY WITH BILATERAL SALPINGECTOMY (Bilateral ) CYSTOSCOPY (N/A ) TOTAL HYSTERECTOMY ABDOMINAL (N/A ) APPLICATION OF WOUND VAC  Patient Location: PACU  Anesthesia Type:General  Level of Consciousness: drowsy  Airway & Oxygen Therapy: Patient Spontanous Breathing and Patient connected to face mask oxygen  Post-op Assessment: Report given to RN and Post -op Vital signs reviewed and stable  Post vital signs: Reviewed and stable  Last Vitals:  Vitals Value Taken Time  BP 135/74 07/14/20 1751  Temp    Pulse 75 07/14/20 1755  Resp 12 07/14/20 1755  SpO2 100 % 07/14/20 1755  Vitals shown include unvalidated device data.  Last Pain:  Vitals:   07/14/20 1115  TempSrc: Oral  PainSc: 0-No pain         Complications: No complications documented.

## 2020-07-14 NOTE — Op Note (Addendum)
Jessica Hampton PROCEDURE DATE: 07/14/2020  PREOPERATIVE DIAGNOSIS:  Symptomatic fibroids, menorrhagia POSTOPERATIVE DIAGNOSIS:  Symptomatic fibroids, menorrhagia, endometriosis  SURGEON:   Benjaman Kindler, MD. ASSISTANT: Laverta Baltimore, M.D. ANESTHESIOLOGIST: Gunnar Fusi, MD Anesthesiologist: Molli Barrows, MD; Gunnar Fusi, MD; Arita Miss, MD CRNA: Demetrius Charity, CRNA; Tollie Eth, CRNA; Rolla Plate, CRNA; Allean Found, CRNA  OPERATION:   Total laparoscopic hysterectomy, converted to a total abdominal hysterectomy, Bilateral Salpingectomy, cystoscopy  This was a significantly difficult case, requiring a full surgeon as an Environmental consultant, and requiring an opening into an exploratory laparotomy.  This case lost a lot of blood, and took many hours.  ANESTHESIA:  General endotracheal.  INDICATIONS: The patient is a 49 y.o.  with the aforementioned diagnoses who desires definitive surgical management. On the preoperative visit, the risks, benefits, indications, and alternatives of the procedure were reviewed with the patient.  On the day of surgery, the risks of surgery were again discussed with the patient including but not limited to: bleeding which may require transfusion or reoperation; infection which may require antibiotics; injury to bowel, bladder, ureters or other surrounding organs; need for additional procedures; thromboembolic phenomenon, incisional problems and other postoperative/anesthesia complications. Written informed consent was obtained.    OPERATIVE FINDINGS: Significantly enlarged uterus, with an intramural fibroid that was larger than the uterus and growing along the left lateral uterine isthmus, encompassing the cervix, bladder, lower half of the uterus, and obscuring the posterior cul-de-sac.  Adhesions between the colon and the left pelvic sidewall. Omental adhesions superior to the umbilicus Normal right lobe of the liver.  Left lobe was  obscured by adhesions. Endometriosis noted along the uterosacral ligaments and pelvic sidewall.  Filmy adhesions noted throughout the pelvis. Bleeding was noted throughout the pelvis.  There were small subcutaneous vessels bleeding at the close of the case, highlighting how difficult it was to control her bleeding throughout the case.  ESTIMATED BLOOD LOSS: 1200 ml FLUIDS:  See anesthesia's report URINE OUTPUT:  600 ml of clear yellow urine. SPECIMENS:  Uterus,cervix,  bilateral fallopian tubes sent to pathology COMPLICATIONS:  None immediate.   DESCRIPTION OF PROCEDURE:  The patient received intravenous antibiotics and had sequential compression devices applied to her lower extremities while in the preoperative area.   She was taken to the operating room and placed under general anesthesia without difficulty.The abdomen and perineum were prepped and draped in a sterile manner, and she was placed in lithotomy position.  Time out was performed.   A V-care uterine manipulator was placed at this time.  A Foley catheter was inserted into her bladder and attached to constant drainage. Attention was turned to the abdomen and 0.5% Marcaine infused subq. A 43mm umbilical incision was made with the scalpel.  The Optiview 5-mm trocar and sleeve were then advanced without difficulty with the laparoscope under direct visualization into the abdomen.  The abdomen was then insufflated with carbon dioxide gas and adequate pneumoperitoneum was obtained.  A survey of the patient's pelvis and abdomen revealed the findings above.  Bilateral lower quadrant ports (5 mm on the right and 11 mm on the left) were then placed under direct visualization.  The pelvis was then carefully examined.   The endometriosis was noted, and filmy adhesions were taken down throughout the pelvis.  The left colon was noted to be adhesed to the left pelvic sidewall at the brim, and this was carefully dissected down.  Control of bleeding on the  left side was started,  and the ovary were removed from the left utero-ovarian ligament.  The round ligament on the side was transected, and the fibroid was carefully dissected out.  At this time, a significant amount of bleeding was encountered along the lateral edge of the fibroid.  After several attempts to control the bleeding, which was able to be controlled, we decided to convert to an open case.  The reason for this conversion was we were unable to see the uterine artery along the left side, nor were we able to identify any of the landmarks that would allow [place] to react quickly in the case of significant bleeding.  We were also unable to see the ureters, as the fibroid obscured the entire retroperitoneal space along the left pelvic sidewall, the left cul-de-sac, and the anterior cervix.  We were able to carefully go retroperitoneal to enucleate the fibroid, but in the case of heavy bleeding this would not have been a safe procedure.  A Pfannensteil skin incision was made. This incision was taken down to the fascia using electrocautery with care given to maintain good hemostasis. The fascia was incised in the midline and the fascial incision was then extended bilaterally using electrocautery without difficulty. The fascia was then dissected off the underlying rectus muscles using blunt and sharp dissection. The rectus muscles were split bluntly in the midline and the peritoneum entered sharply without complication. This peritoneal incision was then extended superiorly and inferiorly with care given to prevent bowel or bladder injury. Attention was then turned to the pelvis. A retractor was placed into the incision, and the bowel was packed away with moist laparotomy sponges. The uterus at this point was noted to be firmly adherent in the pelvis, and was unable to be delivered up out of the abdomen.  The round ligaments on the right side were clamped, suture ligated with 0 Vicryl, and transected with  electrocautery allowing entry into the broad ligament. Of note, all sutures used in this procedure are 0 Vicryl unless otherwise noted. The anterior and posterior leaves of the broad ligament were separated, and the ureters were inspected to be safely away from the area of dissection bilaterally.    The left fibroid was enucleated, and once bleeding controlled to the level of the uterine arteries, the fibroid was amputated.  Adnexae were clamped on the patient's right side, cut, and doubly suture ligated. This procedure was repeated in an identical fashion on the left site allowing for both adnexa to remain in place.  Kelly clamps were placed on the mesosalpinx of the right fallopian tube, and the fallopian tube was excised.  The pedicle was then secured with a free tie.  A similar process was carried out on the left side, allowing for bilateral salpingectomy.     A bladder flap was then created.  The bladder was then bluntly dissected off the lower uterine segment and cervix with good hemostasis noted. T  The uterosacral ligaments were then clamped, cut, and ligated bilaterally.  Finally, the cardinal ligaments were clamped, cut, and ligated bilaterally.  Acutely curved clamps were placed across the vagina just under the cervix, and the specimen was amputated and sent to pathology. The vaginal cuff angles were closed with Heaney stiches with care given to incorporate the uterosacral-cardinal ligament pedicles on both sides.   The middle of the vaginal cuff was closed with a series of interrupted figure-of-eight sutures with care given to incorporate the anterior pubocervical fascia and the posterior rectovaginal fascia.   The  pelvis was irrigated and hemostasis was difficult, as it was throughout the case. However, with multiple hemostatic sutures placed along all peritoneal surfaces, hemostasis was able to be reconfirmed at all pedicles and along the pelvic sidewall.  The ureters were inspected and noted  to be peristalsing bilaterally.  All laparotomy sponges and instruments were removed from the abdomen.   The fascia was also closed in a running fashion with PDS. The subcutaneous layer was reapproximated with 2-0 plain gut. The skin was closed with ensorb staples. Sponge, lap, needle, and instrument counts were correct times two.  Her abdomen was recent insufflated through the umbilical trocar, and the left trocar site, which was an 11 mm, was closed using the cone fascial closure system.  A Prevena wound VAC was placed over the Pfannenstiel incision.  An xray was taken.  3 g of Ancef was redosed at 4 hours.  I will get CBC and PT/INR and fibrinogen prior to her waking.  She will be on a PCA overnight.  Exparel was used along the fascial and skin lines.  A cystoscopy was performed, and the bladder was noted to be intact throughout, with excellent eflux of urine from both ureteral orifices.  The patient was taken to the recovery area awake, extubated and in stable condition.

## 2020-07-14 NOTE — Interval H&P Note (Signed)
History and Physical Interval Note:  07/14/2020 11:35 AM  Jessica Hampton  has presented today for surgery, with the diagnosis of fibroid uterus 7 cm.  The various methods of treatment have been discussed with the patient and family. After consideration of risks, benefits and other options for treatment, the patient has consented to  Procedure(s): TOTAL LAPAROSCOPIC HYSTERECTOMY WITH BILATERAL SALPINGECTOMY WITH MORSELLATION (Bilateral) POSSIBLE CYSTOSCOPY (N/A) POSSIBLE TOTAL HYSTERECTOMY ABDOMINAL (N/A) as a surgical intervention.  The patient's history has been reviewed, patient examined, no change in status, stable for surgery.  I have reviewed the patient's chart and labs.  Questions were answered to the patient's satisfaction.     Benjaman Kindler

## 2020-07-14 NOTE — Anesthesia Preprocedure Evaluation (Signed)
Anesthesia Evaluation  Patient identified by MRN, date of birth, ID band Patient awake    Reviewed: Allergy & Precautions, NPO status , Patient's Chart, lab work & pertinent test results, reviewed documented beta blocker date and time   History of Anesthesia Complications (+) PONV and history of anesthetic complications  Airway Mallampati: II  TM Distance: <3 FB Neck ROM: Full    Dental no notable dental hx. (+) Teeth Intact   Pulmonary neg pulmonary ROS, neg sleep apnea, neg COPD,    Pulmonary exam normal breath sounds clear to auscultation- rhonchi (-) wheezing      Cardiovascular Exercise Tolerance: Good hypertension, Pt. on medications and Pt. on home beta blockers (-) CAD and (-) Past MI Normal cardiovascular exam Rhythm:Regular Rate:Normal - Systolic murmurs and - Diastolic murmurs    Neuro/Psych PSYCHIATRIC DISORDERS Anxiety Stroke in march; loop recorder in place. Says she has done a lot of tests and scans and no reason had been given to her for the stroke CVA, No Residual Symptoms    GI/Hepatic Neg liver ROS, GERD  Medicated,  Endo/Other  negative endocrine ROSneg diabetes  Renal/GU negative Renal ROS     Musculoskeletal negative musculoskeletal ROS (+)   Abdominal Normal abdominal exam  (+) + obese,   Peds  Hematology  (+) anemia ,   Anesthesia Other Findings Past Medical History: No date: Anemia No date: Complication of anesthesia No date: Family history of adverse reaction to anesthes*     Comment: MOM NAUSEATED No date: GERD (gastroesophageal reflux disease)     Comment: OCC No date: Hypertension No date: PONV (postoperative nausea and vomiting)     Comment: NAUSEATED   Reproductive/Obstetrics                            Anesthesia Physical  Anesthesia Plan  ASA: III  Anesthesia Plan: General   Post-op Pain Management:    Induction: Intravenous and Rapid  sequence  PONV Risk Score and Plan: 4 or greater and Ondansetron, Dexamethasone, Midazolam, TIVA, Propofol infusion and Scopolamine patch - Pre-op  Airway Management Planned: Oral ETT  Additional Equipment: None  Intra-op Plan:   Post-operative Plan: Extubation in OR  Informed Consent: I have reviewed the patients History and Physical, chart, labs and discussed the procedure including the risks, benefits and alternatives for the proposed anesthesia with the patient or authorized representative who has indicated his/her understanding and acceptance.     Dental advisory given  Plan Discussed with: CRNA, Surgeon and Anesthesiologist  Anesthesia Plan Comments: (Discussed risks of anesthesia with patient, including PONV, sore throat, lip/dental damage. Rare risks discussed as well, such as cardiorespiratory and neurological sequelae. Patient understands.)        Anesthesia Quick Evaluation

## 2020-07-14 NOTE — Progress Notes (Signed)
Day of Surgery Procedure(s) with comments: TOTAL LAPAROSCOPIC HYSTERECTOMY WITH BILATERAL SALPINGECTOMY (Bilateral) CYSTOSCOPY (N/A) TOTAL HYSTERECTOMY ABDOMINAL (N/A) APPLICATION OF WOUND VAC - serial# [id number] Subjective: Pain is adequately controlled. No SOB or CP. Resting quietly.   Objective: Vital signs in last 24 hours: Temp:  [97.5 F (36.4 C)-98.3 F (36.8 C)] 97.5 F (36.4 C) (12/16 1945) Pulse Rate:  [60-89] 65 (12/16 1945) Resp:  [11-20] 20 (12/16 1945) BP: (94-157)/(53-89) 94/53 (12/16 1945) SpO2:  [93 %-100 %] 95 % (12/16 1945) Weight:  [110.2 kg] 110.2 kg (12/16 1115)  Intake/Output  Intake/Output Summary (Last 24 hours) at 07/14/2020 2113 Last data filed at 07/14/2020 1835 Gross per 24 hour  Intake 3400 ml  Output 1300 ml  Net 2100 ml    Physical Exam:  General: Alert and oriented. CV: RRR Lungs: Clear bilaterally. GI: Soft, Nondistended. Incisions: Clean and dry. Urine: Clear Extremities: Nontender, no erythema, no edema.  Assessment/Plan: POD# 0 s/p Procedure(s) with comments: TOTAL LAPAROSCOPIC HYSTERECTOMY WITH BILATERAL SALPINGECTOMY (Bilateral) CYSTOSCOPY (N/A) TOTAL HYSTERECTOMY ABDOMINAL (N/A) APPLICATION OF WOUND VAC - serial# [id number].  - Encourage Incentive spirometry -Advance diet as tolerated, but may do clears overnight -Continue PCA overnight -OOB to chair for 4 hours this afternoon   Benjaman Kindler, MD   LOS: 0 days   Benjaman Kindler 07/14/2020, 9:13 PM

## 2020-07-14 NOTE — Anesthesia Postprocedure Evaluation (Signed)
Anesthesia Post Note  Patient: Jessica Hampton  Procedure(s) Performed: TOTAL LAPAROSCOPIC HYSTERECTOMY WITH BILATERAL SALPINGECTOMY (Bilateral ) CYSTOSCOPY (N/A ) TOTAL HYSTERECTOMY ABDOMINAL (N/A ) APPLICATION OF WOUND VAC  Patient location during evaluation: PACU Anesthesia Type: General Level of consciousness: awake and alert Pain management: pain level controlled Vital Signs Assessment: post-procedure vital signs reviewed and stable Respiratory status: spontaneous breathing and respiratory function stable Cardiovascular status: stable Anesthetic complications: no   No complications documented.   Last Vitals:  Vitals:   07/14/20 1821 07/14/20 1902  BP: 123/64 105/67  Pulse: 62 60  Resp: 13 14  Temp:  36.6 C  SpO2: 93% 95%    Last Pain:  Vitals:   07/14/20 1902  TempSrc: Oral  PainSc:                  Dennard Nip K

## 2020-07-15 ENCOUNTER — Encounter: Payer: Self-pay | Admitting: Obstetrics and Gynecology

## 2020-07-15 LAB — CBC
HCT: 33.2 % — ABNORMAL LOW (ref 36.0–46.0)
Hemoglobin: 10.6 g/dL — ABNORMAL LOW (ref 12.0–15.0)
MCH: 28.1 pg (ref 26.0–34.0)
MCHC: 31.9 g/dL (ref 30.0–36.0)
MCV: 88.1 fL (ref 80.0–100.0)
Platelets: 239 10*3/uL (ref 150–400)
RBC: 3.77 MIL/uL — ABNORMAL LOW (ref 3.87–5.11)
RDW: 13.9 % (ref 11.5–15.5)
WBC: 13.9 10*3/uL — ABNORMAL HIGH (ref 4.0–10.5)
nRBC: 0 % (ref 0.0–0.2)

## 2020-07-15 LAB — BASIC METABOLIC PANEL
Anion gap: 9 (ref 5–15)
BUN: 13 mg/dL (ref 6–20)
CO2: 24 mmol/L (ref 22–32)
Calcium: 8.1 mg/dL — ABNORMAL LOW (ref 8.9–10.3)
Chloride: 103 mmol/L (ref 98–111)
Creatinine, Ser: 0.65 mg/dL (ref 0.44–1.00)
GFR, Estimated: >60 mL/min (ref >60)
Glucose, Bld: 147 mg/dL — ABNORMAL HIGH (ref 70–99)
Potassium: 4 mmol/L (ref 3.5–5.1)
Sodium: 136 mmol/L (ref 135–145)

## 2020-07-15 MED ORDER — ACETAMINOPHEN 500 MG PO TABS
1000.0000 mg | ORAL_TABLET | Freq: Four times a day (QID) | ORAL | Status: DC
  Administered 2020-07-15 (×2): 1000 mg via ORAL
  Filled 2020-07-15 (×4): qty 2

## 2020-07-15 MED ORDER — OXYCODONE HCL 5 MG PO TABS
5.0000 mg | ORAL_TABLET | ORAL | Status: DC | PRN
  Administered 2020-07-15 (×2): 5 mg via ORAL
  Filled 2020-07-15 (×3): qty 1

## 2020-07-15 MED ORDER — ONDANSETRON HCL 4 MG/2ML IJ SOLN: 4.0000 mg | Freq: Four times a day (QID) | INTRAMUSCULAR | Status: DC | PRN

## 2020-07-15 MED ORDER — ONDANSETRON HCL 4 MG PO TABS: 4.0000 mg | ORAL_TABLET | Freq: Four times a day (QID) | ORAL | Status: DC | PRN

## 2020-07-15 MED ORDER — CHEWING GUM (ORBIT) SUGAR FREE
1.0000 | CHEWING_GUM | Freq: Four times a day (QID) | ORAL | Status: DC
  Administered 2020-07-15 – 2020-07-16 (×3): 1 via ORAL
  Filled 2020-07-15 (×2): qty 1

## 2020-07-15 MED ORDER — HYDROMORPHONE HCL 1 MG/ML IJ SOLN: 0.2000 mg | INTRAMUSCULAR | Status: DC | PRN

## 2020-07-15 NOTE — Progress Notes (Signed)
Patient walked three laps around the nurses station.

## 2020-07-15 NOTE — Progress Notes (Signed)
1 Day Post-Op       Procedure(s) with comments: TOTAL LAPAROSCOPIC HYSTERECTOMY WITH BILATERAL SALPINGECTOMY (Bilateral) CYSTOSCOPY (N/A) TOTAL HYSTERECTOMY ABDOMINAL (N/A) APPLICATION OF WOUND VAC - serial# [id number] Subjective: The patient is doing well.  No nausea or vomiting. Pain is adequately controlled, though hasn't used PCA overnight. No gas yet. Pain 2/10  Objective: Vital signs in last 24 hours: Temp:  [97.5 F (36.4 C)-98.4 F (36.9 C)] 98.4 F (36.9 C) (12/17 0402) Pulse Rate:  [60-89] 89 (12/17 0402) Resp:  [11-20] 16 (12/17 0410) BP: (81-157)/(50-89) 109/68 (12/17 0402) SpO2:  [93 %-100 %] 95 % (12/17 0410) Weight:  [110.2 kg] 110.2 kg (12/16 1115)  Intake/Output  Intake/Output Summary (Last 24 hours) at 07/15/2020 0754 Last data filed at 07/15/2020 0410 Gross per 24 hour  Intake 3400 ml  Output 2100 ml  Net 1300 ml    Physical Exam:  General: Alert and oriented. CV: RRR Lungs: Clear bilaterally. GI: Soft, Nondistended. Incisions: Clean and dry. Wound vac in place, no drainage Urine: Clear, Foley in place Extremities: Nontender, no erythema, no edema.  Lab Results: Recent Labs    07/12/20 0929 07/14/20 1655 07/14/20 2347  HGB 13.1 11.0* 10.6*  HCT 42.0 35.1* 33.2*  WBC 6.3 22.2* 13.9*  PLT 228 267 239                 Results for orders placed or performed during the hospital encounter of 07/14/20 (from the past 24 hour(s))  Pregnancy, urine POC     Status: None   Collection Time: 07/14/20 11:08 AM  Result Value Ref Range   Preg Test, Ur NEGATIVE NEGATIVE  CBC     Status: Abnormal   Collection Time: 07/14/20  4:55 PM  Result Value Ref Range   WBC 22.2 (H) 4.0 - 10.5 K/uL   RBC 3.96 3.87 - 5.11 MIL/uL   Hemoglobin 11.0 (L) 12.0 - 15.0 g/dL   HCT 35.1 (L) 36.0 - 46.0 %   MCV 88.6 80.0 - 100.0 fL   MCH 27.8 26.0 - 34.0 pg   MCHC 31.3 30.0 - 36.0 g/dL   RDW 13.8 11.5 - 15.5 %   Platelets 267 150 - 400 K/uL   nRBC 0.0 0.0 - 0.2 %   Fibrinogen     Status: None   Collection Time: 07/14/20  4:55 PM  Result Value Ref Range   Fibrinogen 287 210 - 475 mg/dL  Protime-INR     Status: None   Collection Time: 07/14/20  9:55 PM  Result Value Ref Range   Prothrombin Time 12.7 11.4 - 15.2 seconds   INR 1.0 0.8 - 1.2  CBC     Status: Abnormal   Collection Time: 07/14/20 11:47 PM  Result Value Ref Range   WBC 13.9 (H) 4.0 - 10.5 K/uL   RBC 3.77 (L) 3.87 - 5.11 MIL/uL   Hemoglobin 10.6 (L) 12.0 - 15.0 g/dL   HCT 33.2 (L) 36.0 - 46.0 %   MCV 88.1 80.0 - 100.0 fL   MCH 28.1 26.0 - 34.0 pg   MCHC 31.9 30.0 - 36.0 g/dL   RDW 13.9 11.5 - 15.5 %   Platelets 239 150 - 400 K/uL   nRBC 0.0 0.0 - 0.2 %  Basic metabolic panel     Status: Abnormal   Collection Time: 07/14/20 11:47 PM  Result Value Ref Range   Sodium 136 135 - 145 mmol/L   Potassium 4.0 3.5 - 5.1 mmol/L   Chloride 103  98 - 111 mmol/L   CO2 24 22 - 32 mmol/L   Glucose, Bld 147 (H) 70 - 99 mg/dL   BUN 13 6 - 20 mg/dL   Creatinine, Ser 0.65 0.44 - 1.00 mg/dL   Calcium 8.1 (L) 8.9 - 10.3 mg/dL   GFR, Estimated >60 >60 mL/min   Anion gap 9 5 - 15    Assessment/Plan: 1 Day Post-Op       Procedure(s) with comments: TOTAL LAPAROSCOPIC HYSTERECTOMY WITH BILATERAL SALPINGECTOMY (Bilateral) CYSTOSCOPY (N/A) TOTAL HYSTERECTOMY ABDOMINAL (N/A) APPLICATION OF WOUND VAC - serial# [id number]  -Ambulate, Incentive spirometry -Advance diet as tolerated, starting with clears and gum -Transition off PCA - OOB at least four times today - remove foley  Benjaman Kindler, MD   LOS: 1 day   Benjaman Kindler 07/15/2020, 7:54 AM

## 2020-07-15 NOTE — Progress Notes (Signed)
Pt walked 3 laps around nurses station.  Now passing gas.

## 2020-07-15 NOTE — Progress Notes (Signed)
Pt doing well POD1.  PCA d/c'd, pain controlled by PO pain medications.  Foley catheter d/c'd, voided adequately without problem.  OOB walking multiple times today.  Up in recliner since 10am. Burping, has not passed gas yet. Gum at bedside. Tolerating regular diet, poor appetite but no N/V.

## 2020-07-16 MED ORDER — GABAPENTIN 300 MG PO CAPS: 300.0000 mg | ORAL_CAPSULE | Freq: Every day | ORAL | 0 refills | Status: DC

## 2020-07-16 MED ORDER — SIMETHICONE 80 MG PO CHEW: 80.0000 mg | CHEWABLE_TABLET | Freq: Four times a day (QID) | ORAL | 0 refills | Status: DC | PRN

## 2020-07-16 MED ORDER — SIMETHICONE 80 MG PO CHEW
80.0000 mg | CHEWABLE_TABLET | Freq: Four times a day (QID) | ORAL | Status: DC | PRN
  Administered 2020-07-16: 10:00:00 80 mg via ORAL

## 2020-07-16 MED ORDER — ACETAMINOPHEN 500 MG PO TABS: 1000.0000 mg | ORAL_TABLET | Freq: Four times a day (QID) | ORAL | 0 refills | Status: DC

## 2020-07-16 MED ORDER — DOCUSATE SODIUM 100 MG PO CAPS: 100.0000 mg | ORAL_CAPSULE | Freq: Two times a day (BID) | ORAL | 0 refills | Status: DC

## 2020-07-16 MED ORDER — OXYCODONE HCL 5 MG PO TABS: 5.0000 mg | ORAL_TABLET | Freq: Four times a day (QID) | ORAL | 0 refills | Status: AC | PRN

## 2020-07-16 MED ORDER — ONDANSETRON HCL 4 MG PO TABS: 4.0000 mg | ORAL_TABLET | Freq: Four times a day (QID) | ORAL | 0 refills | Status: DC | PRN

## 2020-07-16 NOTE — Progress Notes (Signed)
Patient discharged to home. Medications/Prescriptions and discharge instructions reviewed with patient who verbalized understanding. Wound vac replaced with Provena portable unit, instructions and patch kit given. Pt discharged with husband via W/C. Will schedule 1 week follow-up as instructed.

## 2020-07-16 NOTE — Discharge Summary (Signed)
GynecologicalDischarge Summary  Patient Name: Jessica Hampton DOB: Jul 28, 1971 MRN: [id number]  Date of Admission: 07/14/2020 Date of Discharge: 07/16/2020   Procedure(s) with comments: TOTAL LAPAROSCOPIC HYSTERECTOMY WITH BILATERAL SALPINGECTOMY (Bilateral) CYSTOSCOPY (N/A) TOTAL HYSTERECTOMY ABDOMINAL (N/A) APPLICATION OF WOUND VAC - serial# [facility] course:   The patient was admitted on the day of scheduled surgery and proceeded to the operating room as scheduled for the above stated procedure.  (For complete operative information, please see operative report).  Postoperatively she was admitted to the floor. Pain was initially managed with dilaudid PCA which was transitioned to PO when the patient was tolerating a regular diet on postoperative day number 1.  Postoperative labs were stable (Hgb 13.1 --> 10.6 and creatinine 0.57 --> 0.65).    Foley cath was discontinued on postoperative day number 1 and patient passed a voiding trial without difficulty.   Patient was felt to be stable for discharge on postoperative day number 2 when she was tolerating a regular diet, pain was controlled with po pain medications, and she was ambulating and voiding without difficulty. Vital signs were stable and physical exam remained benign throughout her hospital stay. Incision was clean,dry, and intact with wound vac.   She will follow up per below for post-op check and wound vac removal.  Rx given for tylenol, oxycodone, Zofran, and Colace .    She was given specific instructions and numbers to call in written and verbal format. She verbalized understanding, agrees with the plan of care, and all questions answered to her satisfaction.  Discharge Physical Exam:  BP 118/62 (BP Location: Left Arm)   Pulse 64   Temp 98.4 F (36.9 C) (Oral)   Resp 18   Ht 5\' 8"  (1.727 m)   Wt 110.2 kg   LMP 07/04/2020 (Exact Date)   SpO2 97%   BMI 36.94 kg/m   General: NAD CV: RRR Pulm: CTABL, nl  effort ABD: s/nd/nt Incision: c/d/i- Wound vac in place. Laparoscopy sites aldo CDI.  DVT Evaluation: LE non-ttp, no evidence of DVT on exam.  Hemoglobin  Date Value Ref Range Status  07/14/2020 10.6 (L) 12.0 - 15.0 g/dL Final   HCT  Date Value Ref Range Status  07/14/2020 33.2 (L) 36.0 - 46.0 % Final      Plan:  Jessica Hampton was discharged to home in good condition. Follow-up appointment at [facility] in 1 weeks   Discharge Medications: Allergies as of 07/16/2020      Reactions   Lisinopril Shortness Of Breath, Cough   No airway swelling.   Nsaids Other (See Comments)   History of gastric bypass    Plavix [clopidogrel Bisulfate] Nausea Only      Medication List    TAKE these medications   acetaminophen 500 MG tablet Commonly known as: TYLENOL Take 2 tablets (1,000 mg total) by mouth every 6 (six) hours.   aspirin EC 325 MG tablet Take 325 mg by mouth daily with lunch.   BARIATRIC MULTIVITAMINS/IRON PO Take 1 tablet by mouth daily with lunch.   buPROPion 150 MG 24 hr tablet Commonly known as: Wellbutrin XL Take 1 tablet (150 mg total) by mouth daily. For anxiety and depression.   docusate sodium 100 MG capsule Commonly known as: COLACE Take 1 capsule (100 mg total) by mouth 2 (two) times daily.   ferrous sulfate 325 (65 FE) MG tablet Take 325 mg by mouth daily with breakfast.   FLUoxetine 20 MG tablet Commonly known as: PROZAC  Take 1 tablet (20 mg total) by mouth daily. For anxiety   gabapentin 300 MG capsule Commonly known as: NEURONTIN Take 1 capsule (300 mg total) by mouth at bedtime for 3 days.   losartan 25 MG tablet Commonly known as: COZAAR Take 25 mg by mouth daily.   ondansetron 4 MG tablet Commonly known as: ZOFRAN Take 1 tablet (4 mg total) by mouth every 6 (six) hours as needed for nausea.   oxyCODONE 5 MG immediate release tablet Commonly known as: Oxy IR/ROXICODONE Take 1 tablet (5 mg total) by mouth every 6 (six)  hours as needed for up to 7 days for moderate pain.   rosuvastatin 20 MG tablet Commonly known as: Crestor Take 1 tablet (20 mg total) by mouth every evening. For cholesterol.   simethicone 80 MG chewable tablet Commonly known as: MYLICON Chew 1 tablet (80 mg total) by mouth 4 (four) times daily as needed for flatulence.        Follow-up Information    Benjaman Kindler, MD Follow up in 1 week(s).   Specialty: Obstetrics and Gynecology Why: post-op visit 1 week for wound vac removal.  Contact information: [place] [address] [phone]               Signed: Francetta Found, CNM 10:48 AM  07/16/2020

## 2020-07-16 NOTE — Progress Notes (Signed)
Obstetric and Gynecology  POD# 2  Subjective  Patient doing well, no complaints, tolerating PO intake, tolerating pain with PO meds, ambulating without difficulty, voiding spontaneously.     Denies CP, SOB, F/C, N/V/D, or leg pain.   Objective  Objective:   Vitals:   07/15/20 2002 07/15/20 2354 07/16/20 0353 07/16/20 0814  BP: 121/71 (!) 113/58 135/71 118/62  Pulse: 78 72 68 64  Resp: 20 20 18 18   Temp: 98.6 F (37 C) 98.1 F (36.7 C) 98 F (36.7 C) 98.4 F (36.9 C)  TempSrc: Oral Oral Oral Oral  SpO2: 96% 95% 99% 97%  Weight:      Height:       Temp:  [98 F (36.7 C)-99 F (37.2 C)] 98.4 F (36.9 C) (12/18 0814) Pulse Rate:  [64-78] 64 (12/18 0814) Resp:  [18-20] 18 (12/18 0814) BP: (101-135)/(58-71) 118/62 (12/18 0814) SpO2:  [95 %-99 %] 97 % (12/18 0814) I/O last 3 completed shifts: In: 2139.3 [I.V.:2139.3] Out: 5500 [Urine:5500] No intake/output data recorded.  Intake/Output Summary (Last 24 hours) at 07/16/2020 1038 Last data filed at 07/16/2020 0330 Gross per 24 hour  Intake 2139.34 ml  Output 3400 ml  Net -1260.66 ml     Current Vital Signs 24h Vital Sign Ranges  T 98.4 F (36.9 C) Temp  Avg: 98.4 F (36.9 C)  Min: 98 F (36.7 C)  Max: 99 F (37.2 C)  BP 118/62 BP  Min: 101/62  Max: 135/71  HR 64 Pulse  Avg: 70  Min: 64  Max: 78  RR 18 Resp  Avg: 18.7  Min: 18  Max: 20  SaO2 97 %  (room air) SpO2  Avg: 97 %  Min: 95 %  Max: 99 %           24 Hour I/O Current Shift I/O  Time Ins Outs 12/17 0701 - 12/18 0700 In: 2139.3 [I.V.:2139.3] Out: 4700 [Urine:4700] No intake/output data recorded.   General: NAD Cardiovascular: RRR, no murmurs Pulmonary: CTAB, normal respiratory effort Abdomen: Benign. Non-tender, +BS, no guarding. Incision: wound vac in place, no erythema or drainage noted.  Extremities: No erythema or cords, no calf tenderness, with normal peripheral pulses.  Labs:  Cultures: Results for orders placed or performed during  the hospital encounter of 07/12/20  SARS CORONAVIRUS 2 (TAT 6-24 HRS) Nasopharyngeal Nasopharyngeal Swab     Status: None   Collection Time: 07/12/20  9:29 AM   Specimen: Nasopharyngeal Swab  Result Value Ref Range Status   SARS Coronavirus 2 NEGATIVE NEGATIVE Final    Comment: (NOTE) SARS-CoV-2 target nucleic acids are NOT DETECTED.  The SARS-CoV-2 RNA is generally detectable in upper and lower respiratory specimens during the acute phase of infection. Negative results do not preclude SARS-CoV-2 infection, do not rule out co-infections with other pathogens, and should not be used as the sole basis for treatment or other patient management decisions. Negative results must be combined with clinical observations, patient history, and epidemiological information. The expected result is Negative.  Fact Sheet for Patients: SugarRoll.be  Fact Sheet for Healthcare Providers: https://www.woods-mathews.com/  This test is not yet approved or cleared by the [place] FDA and  has been authorized for detection and/or diagnosis of SARS-CoV-2 by FDA under an Emergency Use Authorization (EUA). This EUA will remain  in effect (meaning this test can be used) for the duration of the COVID-19 declaration under Se ction 564(b)(1) of the Act, 21 U.S.C. section 360bbb-3(b)(1), unless the authorization is terminated or  revoked sooner.  Performed at [facility], [place] [address] [place], [place] [address]     Imaging: DG Abd Portable 1V  Result Date: 07/14/2020 CLINICAL DATA:  49 year old female with incorrect sponge count. EXAM: PORTABLE ABDOMEN - 1 VIEW COMPARISON:  None. FINDINGS: No definite radiopaque foreign object identified. There is no bowel dilatation or evidence of obstruction. Obvious free air or radiopaque calculi. The osseous structures and soft tissues are grossly unremarkable. IMPRESSION: No radiopaque foreign object. Electronically  Signed   By: Anner Crete M.D.   On: 07/14/2020 17:50     Assessment   49 y.o. POD2 s/p Procedure(s) with comments: TOTAL LAPAROSCOPIC HYSTERECTOMY WITH BILATERAL SALPINGECTOMY (Bilateral) CYSTOSCOPY (N/A) TOTAL HYSTERECTOMY ABDOMINAL (N/A) APPLICATION OF WOUND VAC - serial# [id number]   Plan   1. D/w Dr Leafy Ro, pt desires DC home.  Meeting all post-op goals. Pain well controlled, taking prn tylenol and oxycodone, passing gas and burping, able to ambulate independently. Wound vac in place.    Francetta Found, CNM 07/16/2020 10:44 AM

## 2020-07-18 LAB — SURGICAL PATHOLOGY

## 2020-09-13 ENCOUNTER — Other Ambulatory Visit: Payer: Self-pay

## 2020-09-13 DIAGNOSIS — Z8673 Personal history of transient ischemic attack (TIA), and cerebral infarction without residual deficits: Secondary | ICD-10-CM

## 2020-09-13 MED ORDER — ROSUVASTATIN CALCIUM 20 MG PO TABS: 20.0000 mg | ORAL_TABLET | Freq: Every evening | ORAL | 1 refills | Status: DC

## 2020-09-14 ENCOUNTER — Other Ambulatory Visit: Payer: Self-pay | Admitting: Primary Care

## 2020-09-14 DIAGNOSIS — Z8673 Personal history of transient ischemic attack (TIA), and cerebral infarction without residual deficits: Secondary | ICD-10-CM

## 2020-09-15 NOTE — Telephone Encounter (Signed)
Called and scheduled cpe 3/4

## 2020-09-15 NOTE — Telephone Encounter (Signed)
Patient due for CPE and general follow up in March, please schedule.

## 2020-09-30 ENCOUNTER — Encounter: Payer: Managed Care, Other (non HMO) | Admitting: Primary Care

## 2020-11-16 ENCOUNTER — Telehealth: Payer: Self-pay

## 2020-11-16 NOTE — Telephone Encounter (Signed)
Pt has upcoming appt for CPE and labs... pt would like to know if you could add hormone labs to your orders as she had a hysterectomy 06/2020

## 2020-11-16 NOTE — Telephone Encounter (Signed)
I am not opposed to adding hormone labs, but if she had a complete hysterectomy (ovaries removed) then she definitely will lack those hormones. Would she be willing to have labs done during her visit on May 20th (rather than prior to her visit) so we can discuss further?

## 2020-11-18 ENCOUNTER — Encounter: Payer: Managed Care, Other (non HMO) | Admitting: Primary Care

## 2020-11-18 NOTE — Telephone Encounter (Signed)
Pt returned call... pt states she still has her ovaries... pt is okay waiting until the CPE visit for labs to be drawn... If you want her to cancel lab appt just let her know, if you are going to order the labs for her to have prior to the appt, call back is not needed and pt will keep both lab and CPE OV as is.Marland KitchenMarland Kitchen

## 2020-11-18 NOTE — Telephone Encounter (Signed)
Left message to return call to our office.  

## 2020-12-12 ENCOUNTER — Other Ambulatory Visit: Payer: Managed Care, Other (non HMO)

## 2020-12-16 ENCOUNTER — Encounter: Payer: Managed Care, Other (non HMO) | Admitting: Primary Care

## 2021-02-03 ENCOUNTER — Encounter: Payer: Managed Care, Other (non HMO) | Admitting: Primary Care

## 2021-02-24 ENCOUNTER — Telehealth (INDEPENDENT_AMBULATORY_CARE_PROVIDER_SITE_OTHER): Payer: Managed Care, Other (non HMO) | Admitting: Family Medicine

## 2021-02-24 ENCOUNTER — Telehealth: Payer: Self-pay

## 2021-02-24 ENCOUNTER — Other Ambulatory Visit: Payer: Self-pay

## 2021-02-24 ENCOUNTER — Encounter: Payer: Self-pay | Admitting: Family Medicine

## 2021-02-24 ENCOUNTER — Other Ambulatory Visit (INDEPENDENT_AMBULATORY_CARE_PROVIDER_SITE_OTHER): Payer: Managed Care, Other (non HMO)

## 2021-02-24 ENCOUNTER — Other Ambulatory Visit: Payer: Self-pay | Admitting: Family Medicine

## 2021-02-24 DIAGNOSIS — J069 Acute upper respiratory infection, unspecified: Secondary | ICD-10-CM

## 2021-02-24 NOTE — Progress Notes (Addendum)
Virtual Visit via Video Note  I connected with Jessica Hampton on 02/24/21 at 10:30 AM EDT by a video enabled telemedicine application and verified that I am speaking with the correct person using two identifiers.  Location: Patient: home Provider: office   I discussed the limitations, risks, security and privacy concerns of performing an evaluation and management service by telephone and the availability of in person appointments. I also discussed with the patient that there may be a patient responsible charge related to this service. The patient expressed understanding and agreed to proceed.  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 Jessica Hampton presents for c/o fever and dry cough after covid exposure She has past h/o CVA   Husband has similar symptoms (thinks he may have covid)  Her symptoms started yesterday   Severe HA-crown of head  Dry cough (no phlegm)  No wheeze or sob  Low grade temp  ST-mild  Mucous from nose is clear   No rash  No tick bites   No n/v/d    Home tests times 2 neg  At work-exp  Otc Tylenol   Drinking fluids -water  No appetite  Nl taste and smell    Covid immunized w/o booster     Lab Results  Component Value Date   CREATININE 0.65 07/14/2020   BUN 13 07/14/2020   NA 136 07/14/2020   K 4.0 07/14/2020   CL 103 07/14/2020   CO2 24 07/14/2020  That gfr was over 60 She takes crestor for hyperlipidemia   Patient Active Problem List   Diagnosis Date Noted   Viral URI with cough 02/24/2021   Fibroids, intramural 07/14/2020   Abnormal uterine bleeding 04/29/2020   GAD (generalized anxiety disorder) 10/08/2019   History of CVA (cerebrovascular accident) 10/04/2019   Hypertensive urgency 10/04/2019   History of gastric surgery 10/02/2016   HYPERTENSION, BENIGN ESSENTIAL 04/15/2007   POLYCYSTIC OVARIAN DISEASE 04/14/2007   Past Medical History:   Diagnosis Date   Acute CVA (cerebrovascular accident) ([place]) 10/04/2019   Anemia    Anxiety    Complication of anesthesia    Family history of adverse reaction to anesthesia    MOM NAUSEATED   GERD (gastroesophageal reflux disease)    OCC   Hypertension    PONV (postoperative nausea and vomiting)    NAUSEATED   Past Surgical History:  Procedure Laterality Date   ABDOMINAL HYSTERECTOMY N/A 07/14/2020   Procedure: TOTAL HYSTERECTOMY ABDOMINAL;  Surgeon: Benjaman Kindler, MD;  Location: [facility];  Service: Gynecology;  Laterality: N/A;   APPLICATION OF WOUND VAC  07/14/2020   Procedure: APPLICATION OF WOUND VAC;  Surgeon: Benjaman Kindler, MD;  Location: [facility];  Service: Gynecology;;  serial# [id number]   CYSTOSCOPY N/A 07/14/2020   Procedure: Consuela Mimes;  Surgeon: Benjaman Kindler, MD;  Location: [facility];  Service: Gynecology;  Laterality: N/A;   LAPAROSCOPIC GASTRIC SLEEVE RESECTION WITH HIATAL HERNIA REPAIR N/A 10/02/2016   Procedure: LAPAROSCOPIC GASTRIC SLEEVE RESECTION WITH HIATAL HERNIA REPAIR;  Surgeon: Bonner Puna, MD;  Location: [facility];  Service: General;  Laterality: N/A;   LOOP RECORDER INSERTION N/A 11/19/2019   Procedure: LOOP RECORDER INSERTION;  Surgeon: Isaias Cowman, MD;  Location: [place] [facility];  Service: Cardiovascular;  Laterality: N/A;   TEE WITHOUT CARDIOVERSION N/A 11/24/2019   Procedure: TRANSESOPHAGEAL ECHOCARDIOGRAM (TEE);  Surgeon: Teodoro Spray, MD;  Location: [facility];  Service: Cardiovascular;  Laterality: N/A;   TOTAL LAPAROSCOPIC HYSTERECTOMY WITH SALPINGECTOMY Bilateral 07/14/2020   Procedure: TOTAL LAPAROSCOPIC HYSTERECTOMY WITH BILATERAL SALPINGECTOMY;  Surgeon: Benjaman Kindler, MD;  Location: [facility];  Service: Gynecology;  Laterality: Bilateral;   TUBAL LIGATION  2004   Social History   Tobacco Use   Smoking status: Never   Smokeless tobacco: Never  Vaping Use   Vaping Use: Never used  Substance Use Topics   Alcohol use: No    Drug use: No   Family History  Problem Relation Age of Onset   Hypertension Mother    Hyperlipidemia Father    Hypertension Father    Allergies  Allergen Reactions   Lisinopril Shortness Of Breath and Cough    No airway swelling.   Nsaids Other (See Comments)    History of gastric bypass    Current Outpatient Medications on File Prior to Visit  Medication Sig Dispense Refill   acetaminophen (TYLENOL) 500 MG tablet Take 2 tablets (1,000 mg total) by mouth every 6 (six) hours. 30 tablet 0   aspirin EC 325 MG tablet Take 325 mg by mouth daily with lunch.     buPROPion (WELLBUTRIN XL) 150 MG 24 hr tablet Take 1 tablet (150 mg total) by mouth daily. For anxiety and depression. 90 tablet 3   docusate sodium (COLACE) 100 MG capsule Take 1 capsule (100 mg total) by mouth 2 (two) times daily. 30 capsule 0   ferrous sulfate 325 (65 FE) MG tablet Take 325 mg by mouth daily with breakfast.     FLUoxetine (PROZAC) 20 MG tablet Take 1 tablet (20 mg total) by mouth daily. For anxiety 90 tablet 3   losartan (COZAAR) 25 MG tablet Take 25 mg by mouth daily.     Multiple Vitamins-Minerals (BARIATRIC MULTIVITAMINS/IRON PO) Take 1 tablet by mouth daily with lunch.      ondansetron (ZOFRAN) 4 MG tablet Take 1 tablet (4 mg total) by mouth every 6 (six) hours as needed for nausea. 20 tablet 0   rosuvastatin (CRESTOR) 20 MG tablet TAKE 1 TABLET(20 MG) BY MOUTH EVERY EVENING FOR CHOLESTEROL 30 tablet 0   simethicone (MYLICON) 80 MG chewable tablet Chew 1 tablet (80 mg total) by mouth 4 (four) times daily as needed for flatulence. 60 tablet 0   gabapentin (NEURONTIN) 300 MG capsule Take 1 capsule (300 mg total) by mouth at bedtime for 3 days. 3 capsule 0   No current facility-administered medications on file prior to visit.   Review of Systems  Constitutional:  Positive for fever and malaise/fatigue. Negative for chills.  HENT:  Positive for congestion and sore throat. Negative for ear pain and sinus pain.    Eyes:  Negative for blurred vision, discharge and redness.  Respiratory:  Positive for cough. Negative for hemoptysis, sputum production, shortness of breath, wheezing and stridor.   Cardiovascular:  Negative for chest pain, palpitations and leg swelling.  Gastrointestinal:  Negative for abdominal pain, diarrhea, nausea and vomiting.  Musculoskeletal:  Negative for myalgias.  Skin:  Negative for rash.  Neurological:  Positive for headaches. Negative for dizziness.   Observations/Objective: Patient appears well, in no distress Weight is baseline  No facial swelling or asymmetry Points to area of headache pain as the crown Hoarse voice noted  No obvious tremor or mobility impairment Moving neck and UEs normally Able to hear the call well  No cough or shortness of breath during interview  Talkative and mentally sharp with no cognitive changes No  skin changes on face or neck , no rash or pallor Affect is normal    Assessment and Plan: Problem List Items Addressed This Visit       Respiratory   Viral URI with cough    1 day of symptoms with past exp to covid -pt has h/o cva Husband has same symptoms Home covid test neg Ordered pcr today-will to that  If pos plan to tx with Molnupiravir (not paxlovid due to med interactions) Symptom care discussed  ER parameters disc Pend pcr result for covid           Follow Up Instructions: The office will call you to set up a PCR covid test   Drink fluids and rest  mucinex DM is good for cough and congestion (if cough only and dry then delsym) Nasal saline for congestion as needed  Tylenol for fever or pain or headache  Try ice pack for headache also  Please alert [place] if symptoms worsen (if severe or short of breath please go to the ER)   If covid test is positive we would consider anti viral treatment (Molnupiravir)   I discussed the assessment and treatment plan with the patient. The patient was provided an opportunity to ask  questions and all were answered. The patient agreed with the plan and demonstrated an understanding of the instructions.   The patient was advised to call back or seek an in-person evaluation if the symptoms worsen or if the condition fails to improve as anticipated.     Loura Pardon, MD

## 2021-02-24 NOTE — Assessment & Plan Note (Signed)
1 day of symptoms with past exp to covid -pt has h/o cva Husband has same symptoms Home covid test neg Ordered pcr today-will to that  If pos plan to tx with Molnupiravir (not paxlovid due to med interactions) Symptom care discussed  ER parameters disc Pend pcr result for covid

## 2021-02-24 NOTE — Telephone Encounter (Signed)
I spoke with pt; scratchy throat, H/A pain level now is 6 - 7, dry cough and runny nose.last night temp 99.8 pt taking tylenol for fever and does not think has fever this morning. On and off chills but no SOB or diarrhea and no other covid symptoms. Pt has hypertension and had a stroke last year. Home covid test this morning that was neg. Pt scheduled video visit 02/24/21 at 10:30 with Dr Glori Bickers. Sending note to DR Glori Bickers and Gentry Fitz NP as PCP. Pt will have vitals ready when CMA calls. UC & ED precautions given and pt voiced understanding.

## 2021-02-24 NOTE — Telephone Encounter (Signed)
Pt left VM on triage line stating that she has been exposed to her covid positive husband. She is having symptoms of congestion, scratchy throat, cough and headache. Home test was negative this morning.  Forwarding to [place] to triage.

## 2021-02-24 NOTE — Telephone Encounter (Signed)
Noted and appreciate Dr. Marliss Coots evaluation.

## 2021-02-24 NOTE — Patient Instructions (Signed)
The office will call you to set up a PCR covid test   Drink fluids and rest  mucinex DM is good for cough and congestion (if cough only and dry then delsym) Nasal saline for congestion as needed  Tylenol for fever or pain or headache  Try ice pack for headache also  Please alert [place] if symptoms worsen (if severe or short of breath please go to the ER)   If covid test is positive we would consider anti viral treatment (Molnupiravir)

## 2021-02-25 ENCOUNTER — Encounter: Payer: Self-pay | Admitting: Family Medicine

## 2021-02-25 LAB — NOVEL CORONAVIRUS, NAA: SARS-CoV-2, NAA: NOT DETECTED

## 2021-02-25 LAB — SARS-COV-2, NAA 2 DAY TAT

## 2021-02-27 NOTE — Telephone Encounter (Signed)
Patient left a voicemail stating that her husband tested positive for covid. Patient stated that she is still having mild symptoms. Patient stated that she has a headache, scratchy throat and drainage.

## 2021-02-27 NOTE — Telephone Encounter (Signed)
I wrote a work note and you should be able to see it in [place].   Update [place] if symptoms do not gradually improve  Take care!

## 2021-03-31 ENCOUNTER — Encounter: Payer: Managed Care, Other (non HMO) | Admitting: Primary Care

## 2021-04-12 NOTE — Telephone Encounter (Signed)
Please move up appointment. Will cover CPE and her anxiety in one visit. Add her anywhere.

## 2021-04-12 NOTE — Telephone Encounter (Signed)
Called not able to leave message will call back

## 2021-04-20 ENCOUNTER — Ambulatory Visit (INDEPENDENT_AMBULATORY_CARE_PROVIDER_SITE_OTHER): Payer: Managed Care, Other (non HMO) | Admitting: Primary Care

## 2021-04-20 ENCOUNTER — Encounter: Payer: Self-pay | Admitting: Primary Care

## 2021-04-20 ENCOUNTER — Other Ambulatory Visit: Payer: Self-pay

## 2021-04-20 VITALS — BP 146/98 | HR 102 | Temp 97.6°F | Ht 68.0 in | Wt 256.0 lb

## 2021-04-20 DIAGNOSIS — Z8673 Personal history of transient ischemic attack (TIA), and cerebral infarction without residual deficits: Secondary | ICD-10-CM | POA: Diagnosis not present

## 2021-04-20 DIAGNOSIS — F411 Generalized anxiety disorder: Secondary | ICD-10-CM | POA: Diagnosis not present

## 2021-04-20 DIAGNOSIS — I10 Essential (primary) hypertension: Secondary | ICD-10-CM

## 2021-04-20 DIAGNOSIS — N939 Abnormal uterine and vaginal bleeding, unspecified: Secondary | ICD-10-CM

## 2021-04-20 DIAGNOSIS — Z1211 Encounter for screening for malignant neoplasm of colon: Secondary | ICD-10-CM

## 2021-04-20 LAB — LIPID PANEL
Cholesterol: 148 mg/dL (ref 0–200)
HDL: 61.2 mg/dL (ref >39.00)
LDL Cholesterol: 50 mg/dL (ref 0–99)
NonHDL: 87.13
Total CHOL/HDL Ratio: 2
Triglycerides: 186 mg/dL — ABNORMAL HIGH (ref 0.0–149.0)
VLDL: 37.2 mg/dL (ref 0.0–40.0)

## 2021-04-20 LAB — COMPREHENSIVE METABOLIC PANEL
ALT: 14 U/L (ref 0–35)
AST: 15 U/L (ref 0–37)
Albumin: 4.4 g/dL (ref 3.5–5.2)
Alkaline Phosphatase: 57 U/L (ref 39–117)
BUN: 17 mg/dL (ref 6–23)
CO2: 29 mEq/L (ref 19–32)
Calcium: 9 mg/dL (ref 8.4–10.5)
Chloride: 104 mEq/L (ref 96–112)
Creatinine, Ser: 0.62 mg/dL (ref 0.40–1.20)
GFR: 104.89 mL/min (ref >60.00)
Glucose, Bld: 100 mg/dL — ABNORMAL HIGH (ref 70–99)
Potassium: 3.9 mEq/L (ref 3.5–5.1)
Sodium: 140 mEq/L (ref 135–145)
Total Bilirubin: 0.6 mg/dL (ref 0.2–1.2)
Total Protein: 7 g/dL (ref 6.0–8.3)

## 2021-04-20 LAB — CBC
HCT: 40.7 % (ref 36.0–46.0)
Hemoglobin: 13.3 g/dL (ref 12.0–15.0)
MCHC: 32.6 g/dL (ref 30.0–36.0)
MCV: 85.9 fl (ref 78.0–100.0)
Platelets: 230 10*3/uL (ref 150.0–400.0)
RBC: 4.74 Mil/uL (ref 3.87–5.11)
RDW: 14 % (ref 11.5–15.5)
WBC: 6.3 10*3/uL (ref 4.0–10.5)

## 2021-04-20 LAB — HEMOGLOBIN A1C: Hgb A1c MFr Bld: 5.9 % (ref 4.6–6.5)

## 2021-04-20 MED ORDER — FLUOXETINE HCL 40 MG PO CAPS: 40.0000 mg | ORAL_CAPSULE | Freq: Every day | ORAL | 3 refills | Status: DC

## 2021-04-20 NOTE — Progress Notes (Signed)
Subjective:    Patient ID: Jessica Hampton, female    DOB: [birth year], 50 y.o.   MRN: [id number]  HPI  Jessica Hampton is a very pleasant 50 y.o. female with a history of hypertension, PCOS, abnormal uterine bleeding, CVA, gastric sleeve, GAD who presents today for follow up of chronic conditions and to discuss anxiety. She is also due for colonoscopy.   1) GAD: Currently managed on fluoxetine 20 mg and bupropion XL 150 mg and is compliant daily. Overall has felt well managed until December 2021.    She's been under a lot stress over the last year with her health and family's health (father and husband). She's had a panic attack with symptoms of nausea, palpitations, sweating about two weeks ago. She is not currently meeting with therapy.   2) Hypertension: Currently managed on losartan 25 mg. She is checking her BP at home sometimes which runs 120's/70's. She denies chest pain, shortness of breath, headaches.   BP Readings from Last 3 Encounters:  04/20/21 (!) 146/98  02/24/21 123/74  07/16/20 118/62   Wt Readings from Last 3 Encounters:  04/20/21 256 lb (116.1 kg)  02/24/21 242 lb (109.8 kg)  07/14/20 242 lb 15.2 oz (110.2 kg)     3) Hyperlipidemia/History of CVA: Currently managed on rosuvastatin 20 mg. Due for repeat lipid panel today. She denies symptoms of unilateral weakness, headaches, dizziness, changes in speech.   Review of Systems  Eyes:  Negative for visual disturbance.  Respiratory:  Negative for shortness of breath.   Cardiovascular:  Negative for chest pain.  Neurological:  Negative for dizziness and headaches.  Psychiatric/Behavioral:  The patient is nervous/anxious.        See HPI        Past Medical History:  Diagnosis Date   Acute CVA (cerebrovascular accident) ([place]) 10/04/2019   Anemia    Anxiety    Complication of anesthesia    Family history of adverse reaction to anesthesia    MOM NAUSEATED   Fibroids, intramural 07/14/2020   GERD  (gastroesophageal reflux disease)    OCC   Hypertension    PONV (postoperative nausea and vomiting)    NAUSEATED    Social History   Socioeconomic History   Marital status: Married    Spouse name: Not on file   Number of children: Not on file   Years of education: Not on file   Highest education level: Not on file  Occupational History   Occupation: Dental Office  Tobacco Use   Smoking status: Never   Smokeless tobacco: Never  Vaping Use   Vaping Use: Never used  Substance and Sexual Activity   Alcohol use: No   Drug use: No   Sexual activity: Not on file  Other Topics Concern   Not on file  Social History Narrative   Lives with husband   Social Determinants of Health   Financial Resource Strain: Not on file  Food Insecurity: Not on file  Transportation Needs: Not on file  Physical Activity: Not on file  Stress: Not on file  Social Connections: Not on file  Intimate Partner Violence: Not on file    Past Surgical History:  Procedure Laterality Date   ABDOMINAL HYSTERECTOMY N/A 07/14/2020   Procedure: TOTAL HYSTERECTOMY ABDOMINAL;  Surgeon: Benjaman Kindler, MD;  Location: [facility];  Service: Gynecology;  Laterality: N/A;   APPLICATION OF WOUND VAC  07/14/2020   Procedure: APPLICATION OF WOUND VAC;  Surgeon: Benjaman Kindler, MD;  Location: [facility];  Service: Gynecology;;  serial# [id number]   CYSTOSCOPY N/A 07/14/2020   Procedure: Consuela Mimes;  Surgeon: Benjaman Kindler, MD;  Location: [facility];  Service: Gynecology;  Laterality: N/A;   LAPAROSCOPIC GASTRIC SLEEVE RESECTION WITH HIATAL HERNIA REPAIR N/A 10/02/2016   Procedure: LAPAROSCOPIC GASTRIC SLEEVE RESECTION WITH HIATAL HERNIA REPAIR;  Surgeon: Bonner Puna, MD;  Location: [facility];  Service: General;  Laterality: N/A;   LOOP RECORDER INSERTION N/A 11/19/2019   Procedure: LOOP RECORDER INSERTION;  Surgeon: Isaias Cowman, MD;  Location: [place] [facility];  Service: Cardiovascular;  Laterality: N/A;   TEE  WITHOUT CARDIOVERSION N/A 11/24/2019   Procedure: TRANSESOPHAGEAL ECHOCARDIOGRAM (TEE);  Surgeon: Teodoro Spray, MD;  Location: [facility];  Service: Cardiovascular;  Laterality: N/A;   TOTAL LAPAROSCOPIC HYSTERECTOMY WITH SALPINGECTOMY Bilateral 07/14/2020   Procedure: TOTAL LAPAROSCOPIC HYSTERECTOMY WITH BILATERAL SALPINGECTOMY;  Surgeon: Benjaman Kindler, MD;  Location: [facility];  Service: Gynecology;  Laterality: Bilateral;   TUBAL LIGATION  2004    Family History  Problem Relation Age of Onset   Hypertension Mother    Hyperlipidemia Father    Hypertension Father     Allergies  Allergen Reactions   Lisinopril Shortness Of Breath and Cough    No airway swelling.   Nsaids Other (See Comments)    History of gastric bypass     Current Outpatient Medications on File Prior to Visit  Medication Sig Dispense Refill   aspirin EC 325 MG tablet Take 325 mg by mouth daily with lunch.     buPROPion (WELLBUTRIN XL) 150 MG 24 hr tablet Take 1 tablet (150 mg total) by mouth daily. For anxiety and depression. 90 tablet 3   losartan (COZAAR) 25 MG tablet Take 25 mg by mouth daily.     Multiple Vitamins-Minerals (BARIATRIC MULTIVITAMINS/IRON PO) Take 1 tablet by mouth daily with lunch.      rosuvastatin (CRESTOR) 20 MG tablet TAKE 1 TABLET(20 MG) BY MOUTH EVERY EVENING FOR CHOLESTEROL 30 tablet 0   No current facility-administered medications on file prior to visit.    BP (!) 146/98   Pulse (!) 102   Temp 97.6 F (36.4 C) (Temporal)   Ht 5\' 8"  (1.727 m)   Wt 256 lb (116.1 kg)   SpO2 95%   BMI 38.92 kg/m  Objective:   Physical Exam Cardiovascular:     Rate and Rhythm: Normal rate and regular rhythm.  Pulmonary:     Effort: Pulmonary effort is normal.     Breath sounds: Normal breath sounds.  Musculoskeletal:     Cervical back: Neck supple.  Skin:    General: Skin is warm and dry.          Assessment & Plan:      This visit occurred during the SARS-CoV-2 public health  emergency.  Safety protocols were in place, including screening questions prior to the visit, additional usage of staff PPE, and extensive cleaning of exam room while observing appropriate contact time as indicated for disinfecting solutions.

## 2021-04-20 NOTE — Patient Instructions (Signed)
Stop by the lab prior to leaving today. I will notify you of your results once received.   We increased the dose of your fluoxetine to 40 mg. I sent a new prescription to your pharmacy.  You will be contacted regarding your referral to therapy.  Please let us know if you have not been contacted within two weeks.   Please update me in 4 weeks.   It was a pleasure to see you today!

## 2021-04-20 NOTE — Assessment & Plan Note (Signed)
Above goal in the office today, home readings are normal.  Continue losartan 25 mg. Will have her monitor BP and report if readings are consistently 130/90's.   CMP pending.

## 2021-04-20 NOTE — Assessment & Plan Note (Signed)
Deteriorated despite bupropion XL 150 mg and fluoxetine 20 mg.  Referral placed for therapy. Increase dose of fluoxetine to 40 mg.  She will update in 4 weeks.

## 2021-04-20 NOTE — Assessment & Plan Note (Signed)
Resolved since hysterectomy in December 2021.

## 2021-04-20 NOTE — Assessment & Plan Note (Signed)
No new symptoms. Compliant to rosuvastatin 20 mg, continue same. LDL goal of <70.

## 2021-04-21 ENCOUNTER — Encounter: Payer: Self-pay | Admitting: Family Medicine

## 2021-04-21 DIAGNOSIS — R7303 Prediabetes: Secondary | ICD-10-CM | POA: Insufficient documentation

## 2021-04-24 ENCOUNTER — Telehealth: Payer: Self-pay

## 2021-04-24 MED ORDER — NA SULFATE-K SULFATE-MG SULF 17.5-3.13-1.6 GM/177ML PO SOLN: 354.0000 mL | Freq: Once | ORAL | 0 refills | Status: AC

## 2021-04-24 NOTE — Telephone Encounter (Signed)
Gastroenterology Pre-Procedure Review  Request Date: 05/26/2021 Requesting Physician: Dr. Bonna Gains   PATIENT REVIEW QUESTIONS: The patient responded to the following health history questions as indicated:    1. Are you having any GI issues? no 2. Do you have a personal history of Polyps? no 3. Do you have a family history of Colon Cancer or Polyps? no 4. Diabetes Mellitus? no 5. Joint replacements in the past 12 months?no 6. Major health problems in the past 3 months?no 7. Any artificial heart valves, MVP, or defibrillator?no    MEDICATIONS & ALLERGIES:    Patient reports the following regarding taking any anticoagulation/antiplatelet therapy:   Plavix, Coumadin, Eliquis, Xarelto, Lovenox, Pradaxa, Brilinta, or Effient? no Aspirin? Yes Asprin 325mg  Dr. Saralyn Pilar   Patient confirms/reports the following medications:  Current Outpatient Medications  Medication Sig Dispense Refill   aspirin EC 325 MG tablet Take 325 mg by mouth daily with lunch.     buPROPion (WELLBUTRIN XL) 150 MG 24 hr tablet Take 1 tablet (150 mg total) by mouth daily. For anxiety and depression. 90 tablet 3   FLUoxetine (PROZAC) 40 MG capsule Take 1 capsule (40 mg total) by mouth daily. For anxiety. 90 capsule 3   losartan (COZAAR) 25 MG tablet Take 25 mg by mouth daily.     Multiple Vitamins-Minerals (BARIATRIC MULTIVITAMINS/IRON PO) Take 1 tablet by mouth daily with lunch.      rosuvastatin (CRESTOR) 20 MG tablet TAKE 1 TABLET(20 MG) BY MOUTH EVERY EVENING FOR CHOLESTEROL 30 tablet 0   No current facility-administered medications for this visit.    Patient confirms/reports the following allergies:  Allergies  Allergen Reactions   Lisinopril Shortness Of Breath and Cough    No airway swelling.   Nsaids Other (See Comments)    History of gastric bypass     No orders of the defined types were placed in this encounter.   AUTHORIZATION INFORMATION Primary Insurance: 1D#: Group #:  Secondary  Insurance: 1D#: Group #:  SCHEDULE INFORMATION: Date:  Time: Location:

## 2021-04-25 ENCOUNTER — Telehealth: Payer: Self-pay

## 2021-04-25 ENCOUNTER — Other Ambulatory Visit: Payer: Self-pay

## 2021-04-25 DIAGNOSIS — Z1211 Encounter for screening for malignant neoplasm of colon: Secondary | ICD-10-CM

## 2021-04-25 NOTE — Telephone Encounter (Signed)
Pt. Request call back about colonoscopy that was scheduled. She needs someone to reach out to her primary care Dr. About the colonoscopy.

## 2021-04-25 NOTE — Telephone Encounter (Signed)
Called and left a message for call back  

## 2021-04-26 NOTE — Telephone Encounter (Signed)
Pt. Returning call left another vm

## 2021-04-26 NOTE — Telephone Encounter (Signed)
Informed patient that we have sent blood thinner request to PCP office. Waiting on response

## 2021-04-26 NOTE — Telephone Encounter (Signed)
Patient left a voicemail to inform us that she wants [place] to send her blood thinner request to her PCP. Did new blood thinner request and fax to PCP office

## 2021-04-28 ENCOUNTER — Encounter: Payer: Managed Care, Other (non HMO) | Admitting: Primary Care

## 2021-05-01 ENCOUNTER — Telehealth: Payer: Self-pay

## 2021-05-01 NOTE — Telephone Encounter (Signed)
Called to inform patient she should stop the Aspirin 325mg  (5) days prior to procedure. Restart 1 day after, per Dr. [name]. Pt verbalized understanding.

## 2021-05-15 ENCOUNTER — Other Ambulatory Visit: Payer: Self-pay | Admitting: Primary Care

## 2021-05-15 DIAGNOSIS — Z8673 Personal history of transient ischemic attack (TIA), and cerebral infarction without residual deficits: Secondary | ICD-10-CM

## 2021-05-26 ENCOUNTER — Telehealth (INDEPENDENT_AMBULATORY_CARE_PROVIDER_SITE_OTHER): Payer: Managed Care, Other (non HMO) | Admitting: Primary Care

## 2021-05-26 ENCOUNTER — Other Ambulatory Visit: Payer: Self-pay

## 2021-05-26 ENCOUNTER — Encounter: Payer: Self-pay | Admitting: Primary Care

## 2021-05-26 ENCOUNTER — Ambulatory Visit
Admission: RE | Admit: 2021-05-26 | Payer: Managed Care, Other (non HMO) | Source: Home / Self Care | Admitting: Gastroenterology

## 2021-05-26 ENCOUNTER — Telehealth: Payer: Self-pay

## 2021-05-26 VITALS — Ht 68.0 in | Wt 256.0 lb

## 2021-05-26 DIAGNOSIS — E66811 Obesity, class 1: Secondary | ICD-10-CM | POA: Insufficient documentation

## 2021-05-26 DIAGNOSIS — Z6838 Body mass index (BMI) 38.0-38.9, adult: Secondary | ICD-10-CM

## 2021-05-26 DIAGNOSIS — E6609 Other obesity due to excess calories: Secondary | ICD-10-CM | POA: Insufficient documentation

## 2021-05-26 DIAGNOSIS — Z6834 Body mass index (BMI) 34.0-34.9, adult: Secondary | ICD-10-CM | POA: Insufficient documentation

## 2021-05-26 DIAGNOSIS — Z6829 Body mass index (BMI) 29.0-29.9, adult: Secondary | ICD-10-CM | POA: Insufficient documentation

## 2021-05-26 MED ORDER — TIRZEPATIDE 2.5 MG/0.5ML ~~LOC~~ SOAJ: 2.5000 mg | SUBCUTANEOUS | 0 refills | Status: DC

## 2021-05-26 NOTE — Assessment & Plan Note (Signed)
Agree to proceed with Moujaro. Discussed warnings and side effects, especially since she has a history of gastric sleeve placement.   Rx for Mounjaro 2.5 mg weekly X 4 weeks sent to pharmacy. Will increase to 5 mg thereafter, she will call after she completes her 4th week of 2.5 mg.   She will continue to track calories and work on her diet. We discussed that she will need regular follow up for monitoring, she agrees.  We will plan to see her back in 4-6 weeks.

## 2021-05-26 NOTE — Progress Notes (Signed)
Patient ID: Jessica Hampton, female    DOB: 1970-12-18, 50 y.o.   MRN: [id number]  Virtual visit completed through [place], a video enabled telemedicine application. Due to national recommendations of social distancing due to COVID-19, a virtual visit is felt to be most appropriate for this patient at this time. Reviewed limitations, risks, security and privacy concerns of performing a virtual visit and the availability of in person appointments. I also reviewed that there may be a patient responsible charge related to this service. The patient agreed to proceed.   Patient location: home Provider location: [place] at [facility], office Persons participating in this virtual visit: patient, provider   If any vitals were documented, they were collected by patient at home unless specified below.    We attempted to conduct a video visit, however, Care[facility] was down at the time. We connected via phone which lasted 11 min and 14 sec.  Ht 5\' 8"  (1.727 m)   Wt 256 lb (116.1 kg)   BMI 38.92 kg/m    CC:  Subjective:   HPI:  Jessica Hampton is a very pleasant 50 y.o. female with a history of hypertensive urgency, PCOS, gastric sleeve surgery in 2018, prediabetes who presents today to discuss obesity and weight loss options.   Long history of obesity, has struggled most of her life. She underwent gastric sleeve in 2018, lost about 50-60 pounds, regained 15 pounds, has been stuck around 240-250 lb range for the last year. She underwent partial hysterectomy in December 2021, has found weight loss to be more difficult since.   She tracks her food daily, eats 1200-1500 calories daily, does not eat "junk food", has cut out creamer in her coffee, does not eat fast food, drinks mostly water. She does not exercise. She's tried the Keto Diet in the past, caused her labs to become abnormal. She does intermittently fast during every day of the week.   She would like to try [facility].   Wt Readings from  Last 3 Encounters:  05/26/21 256 lb (116.1 kg)  04/20/21 256 lb (116.1 kg)  02/24/21 242 lb (109.8 kg)         Relevant past medical, surgical, family and social history reviewed and updated as indicated. Interim medical history since our last visit reviewed. Allergies and medications reviewed and updated. Outpatient Medications Prior to Visit  Medication Sig Dispense Refill   aspirin EC 325 MG tablet Take 325 mg by mouth daily with lunch.     buPROPion (WELLBUTRIN XL) 150 MG 24 hr tablet Take 1 tablet (150 mg total) by mouth daily. For anxiety and depression. 90 tablet 3   FLUoxetine (PROZAC) 40 MG capsule Take 1 capsule (40 mg total) by mouth daily. For anxiety. 90 capsule 3   losartan (COZAAR) 25 MG tablet Take 25 mg by mouth daily.     Multiple Vitamins-Minerals (BARIATRIC MULTIVITAMINS/IRON PO) Take 1 tablet by mouth daily with lunch.      rosuvastatin (CRESTOR) 20 MG tablet TAKE 1 TABLET(20 MG) BY MOUTH EVERY EVENING FOR CHOLESTEROL 90 tablet 3   No facility-administered medications prior to visit.     Per HPI unless specifically indicated in ROS section below Review of Systems  Constitutional:  Negative for unexpected weight change.  Respiratory:  Negative for shortness of breath.   Cardiovascular:  Negative for chest pain.  Objective:  Ht 5\' 8"  (1.727 m)   Wt 256 lb (116.1 kg)   BMI 38.92 kg/m   Wt Readings  from Last 3 Encounters:  05/26/21 256 lb (116.1 kg)  04/20/21 256 lb (116.1 kg)  02/24/21 242 lb (109.8 kg)       Physical exam: General: Alert and oriented x 3, no distress  Pulmonary: Speaks in complete sentences without increased work of breathing, no cough during visit.  Psychiatric: Normal mood, thought content, and behavior.     Results for orders placed or performed in visit on 04/20/21  Lipid panel  Result Value Ref Range   Cholesterol 148 0 - 200 mg/dL   Triglycerides 186.0 (H) 0.0 - 149.0 mg/dL   HDL 61.20 >39.00 mg/dL   VLDL 37.2 0.0 - 40.0  mg/dL   LDL Cholesterol 50 0 - 99 mg/dL   Total CHOL/HDL Ratio 2    NonHDL 87.13   Hemoglobin A1c  Result Value Ref Range   Hgb A1c MFr Bld 5.9 4.6 - 6.5 %  Comprehensive metabolic panel  Result Value Ref Range   Sodium 140 135 - 145 mEq/L   Potassium 3.9 3.5 - 5.1 mEq/L   Chloride 104 96 - 112 mEq/L   CO2 29 19 - 32 mEq/L   Glucose, Bld 100 (H) 70 - 99 mg/dL   BUN 17 6 - 23 mg/dL   Creatinine, Ser 0.62 0.40 - 1.20 mg/dL   Total Bilirubin 0.6 0.2 - 1.2 mg/dL   Alkaline Phosphatase 57 39 - 117 U/L   AST 15 0 - 37 U/L   ALT 14 0 - 35 U/L   Total Protein 7.0 6.0 - 8.3 g/dL   Albumin 4.4 3.5 - 5.2 g/dL   GFR 104.89 >60.00 mL/min   Calcium 9.0 8.4 - 10.5 mg/dL  CBC  Result Value Ref Range   WBC 6.3 4.0 - 10.5 K/uL   RBC 4.74 3.87 - 5.11 Mil/uL   Platelets 230.0 150.0 - 400.0 K/uL   Hemoglobin 13.3 12.0 - 15.0 g/dL   HCT 40.7 36.0 - 46.0 %   MCV 85.9 78.0 - 100.0 fl   MCHC 32.6 30.0 - 36.0 g/dL   RDW 14.0 11.5 - 15.5 %   Assessment & Plan:   Problem List Items Addressed This Visit       Other   Class 2 severe obesity due to excess calories with serious comorbidity and body mass index (BMI) of 38.0 to 38.9 in adult [facility]) - Primary    Agree to proceed with Moujaro. Discussed warnings and side effects, especially since she has a history of gastric sleeve placement.   Rx for Mounjaro 2.5 mg weekly X 4 weeks sent to pharmacy. Will increase to 5 mg thereafter, she will call after she completes her 4th week of 2.5 mg.   She will continue to track calories and work on her diet. We discussed that she will need regular follow up for monitoring, she agrees.  We will plan to see her back in 4-6 weeks.       Relevant Medications   tirzepatide (MOUNJARO) 2.5 MG/0.5ML Pen     Meds ordered this encounter  Medications   tirzepatide (MOUNJARO) 2.5 MG/0.5ML Pen    Sig: Inject 2.5 mg into the skin once a week.    Dispense:  2 mL    Refill:  0    Order Specific Question:    Supervising Provider    Answer:   BEDSOLE, AMY E [[id number]]   No orders of the defined types were placed in this encounter.   I discussed the assessment and treatment plan with  the patient. The patient was provided an opportunity to ask questions and all were answered. The patient agreed with the plan and demonstrated an understanding of the instructions. The patient was advised to call back or seek an in-person evaluation if the symptoms worsen or if the condition fails to improve as anticipated.  Follow up plan:  Start the Mounjaro 2.5 mg once weekly for four weeks.  Notify me when you are down to your last 2.5 mg pen.  Schedule an appointment for 4-6 weeks for weight check.  It was a pleasure to see you today!   Pleas Koch, NP

## 2021-05-26 NOTE — Telephone Encounter (Signed)
   I have submitted but it is most likely going to need  trial of Trulicity, Ozempic, rybelsus  or saxenda or documentation of contraindication.

## 2021-05-26 NOTE — Patient Instructions (Signed)
Start the Mounjaro 2.5 mg once weekly for four weeks.  Notify me when you are down to your last 2.5 mg pen.  Schedule an appointment for 4-6 weeks for weight check.  It was a pleasure to see you today!

## 2021-05-26 NOTE — Telephone Encounter (Signed)
I will notify patient via MyChart.  Can we still wait for the final response?  She may be able to use a coupon card. I'll check.

## 2021-05-29 NOTE — Telephone Encounter (Signed)
Prior Josem Kaufmann was has been denied. Ppw has been placed in your box for your review.

## 2021-06-17 ENCOUNTER — Other Ambulatory Visit: Payer: Self-pay | Admitting: Primary Care

## 2021-06-17 DIAGNOSIS — F411 Generalized anxiety disorder: Secondary | ICD-10-CM

## 2021-06-18 MED ORDER — TIRZEPATIDE 2.5 MG/0.5ML ~~LOC~~ SOAJ: 2.5000 mg | SUBCUTANEOUS | 0 refills | Status: DC

## 2021-06-30 ENCOUNTER — Telehealth (INDEPENDENT_AMBULATORY_CARE_PROVIDER_SITE_OTHER): Payer: Managed Care, Other (non HMO) | Admitting: Nurse Practitioner

## 2021-06-30 ENCOUNTER — Other Ambulatory Visit: Payer: Self-pay

## 2021-06-30 VITALS — BP 118/72 | HR 9 | Temp 99.8°F

## 2021-06-30 DIAGNOSIS — R051 Acute cough: Secondary | ICD-10-CM

## 2021-06-30 DIAGNOSIS — B349 Viral infection, unspecified: Secondary | ICD-10-CM | POA: Diagnosis not present

## 2021-06-30 DIAGNOSIS — R0982 Postnasal drip: Secondary | ICD-10-CM

## 2021-06-30 MED ORDER — FLUTICASONE PROPIONATE 50 MCG/ACT NA SUSP: 2.0000 | Freq: Every day | NASAL | 0 refills | Status: DC

## 2021-06-30 MED ORDER — HYDROCODONE BIT-HOMATROP MBR 5-1.5 MG/5ML PO SOLN
5.0000 mL | Freq: Two times a day (BID) | ORAL | 0 refills | Status: AC | PRN
Start: 2021-06-30 — End: 2021-07-07

## 2021-06-30 NOTE — Progress Notes (Signed)
Patient ID: Jessica Hampton, female    DOB: 09-Nov-1970, 50 y.o.   MRN: [id number]  Virtual visit completed through [place], a video enabled telemedicine application. Due to national recommendations of social distancing due to COVID-19, a virtual visit is felt to be most appropriate for this patient at this time. Reviewed limitations, risks, security and privacy concerns of performing a virtual visit and the availability of in person appointments. I also reviewed that there may be a patient responsible charge related to this service. The patient agreed to proceed.   Patient location: home Provider location: [place] at [facility], office Persons participating in this virtual visit: patient, provider   If any vitals were documented, they were collected by patient at home unless specified below.    BP 118/72 Comment: per patient  Pulse (!) 9   Temp 99.8 F (37.7 C)   SpO2 97%    CC: Nasal congestion/cough Subjective:   HPI: Jessica Hampton is a 50 y.o. female presenting on 06/30/2021 for Nasal Congestion ( Sx started 06/29/21-runny nose, scratchy throat, then progressed to fever-101.8 cough, chest congestion, achy feeling. No SOB or wheezing. Covid test negative 12/1 and 06/30/21. Has been taking OTC Sinus Mucinnex Max, Tylenol and Zaicom.)  Symptoms started 06/29/2021 Covid test today and yesterday were negative  Pfizer vaccine x2 No sick contact, but works in a [employer] OTC has helped some Tmax 101.8   Relevant past medical, surgical, family and social history reviewed and updated as indicated. Interim medical history since our last visit reviewed. Allergies and medications reviewed and updated. Outpatient Medications Prior to Visit  Medication Sig Dispense Refill   aspirin EC 325 MG tablet Take 325 mg by mouth daily with lunch.     buPROPion (WELLBUTRIN XL) 150 MG 24 hr tablet TAKE 1 TABLET(150 MG) BY MOUTH DAILY FOR ANXIETY OR DEPRESSION 90 tablet 2   FLUoxetine (PROZAC)  40 MG capsule Take 1 capsule (40 mg total) by mouth daily. For anxiety. 90 capsule 3   losartan (COZAAR) 25 MG tablet Take 25 mg by mouth daily.     Multiple Vitamins-Minerals (BARIATRIC MULTIVITAMINS/IRON PO) Take 1 tablet by mouth daily with lunch.      rosuvastatin (CRESTOR) 20 MG tablet TAKE 1 TABLET(20 MG) BY MOUTH EVERY EVENING FOR CHOLESTEROL 90 tablet 3   tirzepatide (MOUNJARO) 2.5 MG/0.5ML Pen Inject 2.5 mg into the skin once a week. 2 mL 0   No facility-administered medications prior to visit.     Per HPI unless specifically indicated in ROS section below Review of Systems  Constitutional:  Positive for chills, fatigue and fever.  HENT:  Positive for postnasal drip and sore throat. Negative for congestion, ear discharge, ear pain, sinus pressure and sinus pain.   Eyes:  Positive for discharge (watering).  Respiratory:  Positive for cough (clear). Negative for shortness of breath.   Cardiovascular:  Negative for chest pain.  Gastrointestinal:  Negative for abdominal pain, diarrhea, nausea and vomiting.  Musculoskeletal:  Positive for arthralgias and myalgias.  Neurological:  Negative for headaches.  Objective:  BP 118/72 Comment: per patient  Pulse (!) 9   Temp 99.8 F (37.7 C)   SpO2 97%   Wt Readings from Last 3 Encounters:  05/26/21 256 lb (116.1 kg)  04/20/21 256 lb (116.1 kg)  02/24/21 242 lb (109.8 kg)       Physical exam: Gen: alert, NAD, not ill appearing Pulm: speaks in complete sentences without increased work of breathing Psych:  normal mood, normal thought content      Results for orders placed or performed in visit on 04/20/21  Lipid panel  Result Value Ref Range   Cholesterol 148 0 - 200 mg/dL   Triglycerides 186.0 (H) 0.0 - 149.0 mg/dL   HDL 61.20 >39.00 mg/dL   VLDL 37.2 0.0 - 40.0 mg/dL   LDL Cholesterol 50 0 - 99 mg/dL   Total CHOL/HDL Ratio 2    NonHDL 87.13   Hemoglobin A1c  Result Value Ref Range   Hgb A1c MFr Bld 5.9 4.6 - 6.5 %   Comprehensive metabolic panel  Result Value Ref Range   Sodium 140 135 - 145 mEq/L   Potassium 3.9 3.5 - 5.1 mEq/L   Chloride 104 96 - 112 mEq/L   CO2 29 19 - 32 mEq/L   Glucose, Bld 100 (H) 70 - 99 mg/dL   BUN 17 6 - 23 mg/dL   Creatinine, Ser 0.62 0.40 - 1.20 mg/dL   Total Bilirubin 0.6 0.2 - 1.2 mg/dL   Alkaline Phosphatase 57 39 - 117 U/L   AST 15 0 - 37 U/L   ALT 14 0 - 35 U/L   Total Protein 7.0 6.0 - 8.3 g/dL   Albumin 4.4 3.5 - 5.2 g/dL   GFR 104.89 >60.00 mL/min   Calcium 9.0 8.4 - 10.5 mg/dL  CBC  Result Value Ref Range   WBC 6.3 4.0 - 10.5 K/uL   RBC 4.74 3.87 - 5.11 Mil/uL   Platelets 230.0 150.0 - 400.0 K/uL   Hemoglobin 13.3 12.0 - 15.0 g/dL   HCT 40.7 36.0 - 46.0 %   MCV 85.9 78.0 - 100.0 fl   MCHC 32.6 30.0 - 36.0 g/dL   RDW 14.0 11.5 - 15.5 %   Assessment & Plan:   Problem List Items Addressed This Visit       Other   Viral illness    Patient likely experiencing viral illness she did test for COVID was negative.  Did discuss the possibility of flu.  After having that discussion of possibility of being flu management with changes patient was not very interested in taking Tamiflu if so.  Continue to monitor symptoms continue using over-the-counter treatments as needed.  Use prescription medications as directed.      PND (post-nasal drip)    She may continue over-the-counter treatments as beneficial.  Also consider use of Flonase      Relevant Medications   fluticasone (FLONASE) 50 MCG/ACT nasal spray   Acute cough - Primary    Will write patient some hydrocodone cough syrup.  Continue to monitor      Relevant Medications   fluticasone (FLONASE) 50 MCG/ACT nasal spray   HYDROcodone bit-homatropine (HYCODAN) 5-1.5 MG/5ML syrup     No orders of the defined types were placed in this encounter.  No orders of the defined types were placed in this encounter.   I discussed the assessment and treatment plan with the patient. The patient was provided  an opportunity to ask questions and all were answered. The patient agreed with the plan and demonstrated an understanding of the instructions. The patient was advised to call back or seek an in-person evaluation if the symptoms worsen or if the condition fails to improve as anticipated.  Follow up plan: No follow-ups on file.  Romilda Garret, NP

## 2021-06-30 NOTE — Assessment & Plan Note (Signed)
Patient likely experiencing viral illness she did test for COVID was negative.  Did discuss the possibility of flu.  After having that discussion of possibility of being flu management with changes patient was not very interested in taking Tamiflu if so.  Continue to monitor symptoms continue using over-the-counter treatments as needed.  Use prescription medications as directed.

## 2021-06-30 NOTE — Assessment & Plan Note (Signed)
Will write patient some hydrocodone cough syrup.  Continue to monitor

## 2021-06-30 NOTE — Assessment & Plan Note (Signed)
She may continue over-the-counter treatments as beneficial.  Also consider use of Flonase

## 2021-07-03 ENCOUNTER — Encounter: Payer: Self-pay | Admitting: Nurse Practitioner

## 2021-07-03 ENCOUNTER — Telehealth: Payer: Managed Care, Other (non HMO) | Admitting: Nurse Practitioner

## 2021-07-07 ENCOUNTER — Other Ambulatory Visit: Payer: Self-pay

## 2021-07-07 ENCOUNTER — Encounter: Payer: Self-pay | Admitting: Primary Care

## 2021-07-07 ENCOUNTER — Ambulatory Visit (INDEPENDENT_AMBULATORY_CARE_PROVIDER_SITE_OTHER): Payer: Managed Care, Other (non HMO) | Admitting: Primary Care

## 2021-07-07 DIAGNOSIS — Z6838 Body mass index (BMI) 38.0-38.9, adult: Secondary | ICD-10-CM

## 2021-07-07 DIAGNOSIS — R7303 Prediabetes: Secondary | ICD-10-CM | POA: Diagnosis not present

## 2021-07-07 LAB — COMPREHENSIVE METABOLIC PANEL
ALT: 17 U/L (ref 0–35)
AST: 18 U/L (ref 0–37)
Albumin: 4.5 g/dL (ref 3.5–5.2)
Alkaline Phosphatase: 57 U/L (ref 39–117)
BUN: 16 mg/dL (ref 6–23)
CO2: 31 mEq/L (ref 19–32)
Calcium: 9.6 mg/dL (ref 8.4–10.5)
Chloride: 101 mEq/L (ref 96–112)
Creatinine, Ser: 0.67 mg/dL (ref 0.40–1.20)
GFR: 102.8 mL/min (ref >60.00)
Glucose, Bld: 79 mg/dL (ref 70–99)
Potassium: 3.9 mEq/L (ref 3.5–5.1)
Sodium: 139 mEq/L (ref 135–145)
Total Bilirubin: 0.5 mg/dL (ref 0.2–1.2)
Total Protein: 7.5 g/dL (ref 6.0–8.3)

## 2021-07-07 LAB — HEMOGLOBIN A1C: Hgb A1c MFr Bld: 5.7 % (ref 4.6–6.5)

## 2021-07-07 NOTE — Progress Notes (Signed)
Subjective:    Patient ID: Jessica Hampton, female    DOB: Jun 13, 1971, 50 y.o.   MRN: [id number]  HPI  Jessica Hampton is a very pleasant 50 y.o. female with a history of hypertension, PCOS, CVA, gastric surgery, prediabetes who presents today for follow up of obesity.  Currently prescribed Mounjaro 2.5 mg weekly for obesity and prediabetes that was initiated about 6 weeks ago. She is here today for follow up.  Since her last visit she's lost 18 pounds. She is weighing herself at home and has lost 22 pounds. She does have constipation since starting Mounjaro but has noticed improvement with a stool softener. She's had mild nausea, but overall tolerates well.  She's reduced sugar intake and is no longer binge eating. She is walking 3-4 times weekly.   Wt Readings from Last 3 Encounters:  07/07/21 238 lb (108 kg)  05/26/21 256 lb (116.1 kg)  04/20/21 256 lb (116.1 kg)      Review of Systems  Gastrointestinal:  Positive for constipation and nausea. Negative for abdominal pain and diarrhea.        Past Medical History:  Diagnosis Date   Acute CVA (cerebrovascular accident) ([place]) 10/04/2019   Anemia    Anxiety    Complication of anesthesia    Family history of adverse reaction to anesthesia    MOM NAUSEATED   Fibroids, intramural 07/14/2020   GERD (gastroesophageal reflux disease)    OCC   Hypertension    PONV (postoperative nausea and vomiting)    NAUSEATED    Social History   Socioeconomic History   Marital status: Married    Spouse name: Not on file   Number of children: Not on file   Years of education: Not on file   Highest education level: Not on file  Occupational History   Occupation: Dental Office  Tobacco Use   Smoking status: Never   Smokeless tobacco: Never  Vaping Use   Vaping Use: Never used  Substance and Sexual Activity   Alcohol use: No   Drug use: No   Sexual activity: Not on file  Other Topics Concern   Not on file  Social History  Narrative   Lives with husband   Social Determinants of Health   Financial Resource Strain: Not on file  Food Insecurity: Not on file  Transportation Needs: Not on file  Physical Activity: Not on file  Stress: Not on file  Social Connections: Not on file  Intimate Partner Violence: Not on file    Past Surgical History:  Procedure Laterality Date   ABDOMINAL HYSTERECTOMY N/A 07/14/2020   Procedure: TOTAL HYSTERECTOMY ABDOMINAL;  Surgeon: Benjaman Kindler, MD;  Location: [facility];  Service: Gynecology;  Laterality: N/A;   APPLICATION OF WOUND VAC  07/14/2020   Procedure: APPLICATION OF WOUND VAC;  Surgeon: Benjaman Kindler, MD;  Location: [facility];  Service: Gynecology;;  serial# [id number]   CYSTOSCOPY N/A 07/14/2020   Procedure: Consuela Mimes;  Surgeon: Benjaman Kindler, MD;  Location: [facility];  Service: Gynecology;  Laterality: N/A;   LAPAROSCOPIC GASTRIC SLEEVE RESECTION WITH HIATAL HERNIA REPAIR N/A 10/02/2016   Procedure: LAPAROSCOPIC GASTRIC SLEEVE RESECTION WITH HIATAL HERNIA REPAIR;  Surgeon: Bonner Puna, MD;  Location: [facility];  Service: General;  Laterality: N/A;   LOOP RECORDER INSERTION N/A 11/19/2019   Procedure: LOOP RECORDER INSERTION;  Surgeon: Isaias Cowman, MD;  Location: [place] [facility];  Service: Cardiovascular;  Laterality: N/A;   TEE WITHOUT CARDIOVERSION N/A 11/24/2019  Procedure: TRANSESOPHAGEAL ECHOCARDIOGRAM (TEE);  Surgeon: Teodoro Spray, MD;  Location: [facility];  Service: Cardiovascular;  Laterality: N/A;   TOTAL LAPAROSCOPIC HYSTERECTOMY WITH SALPINGECTOMY Bilateral 07/14/2020   Procedure: TOTAL LAPAROSCOPIC HYSTERECTOMY WITH BILATERAL SALPINGECTOMY;  Surgeon: Benjaman Kindler, MD;  Location: [facility];  Service: Gynecology;  Laterality: Bilateral;   TUBAL LIGATION  2004    Family History  Problem Relation Age of Onset   Hypertension Mother    Hyperlipidemia Father    Hypertension Father     Allergies  Allergen Reactions   Lisinopril  Shortness Of Breath and Cough    No airway swelling.   Nsaids Other (See Comments)    History of gastric bypass     Current Outpatient Medications on File Prior to Visit  Medication Sig Dispense Refill   aspirin EC 325 MG tablet Take 325 mg by mouth daily with lunch.     buPROPion (WELLBUTRIN XL) 150 MG 24 hr tablet TAKE 1 TABLET(150 MG) BY MOUTH DAILY FOR ANXIETY OR DEPRESSION 90 tablet 2   FLUoxetine (PROZAC) 40 MG capsule Take 1 capsule (40 mg total) by mouth daily. For anxiety. 90 capsule 3   fluticasone (FLONASE) 50 MCG/ACT nasal spray Place 2 sprays into both nostrils daily. 16 g 0   HYDROcodone bit-homatropine (HYCODAN) 5-1.5 MG/5ML syrup Take 5 mLs by mouth 2 (two) times daily as needed for up to 7 days for cough. 70 mL 0   losartan (COZAAR) 25 MG tablet Take 25 mg by mouth daily.     Multiple Vitamins-Minerals (BARIATRIC MULTIVITAMINS/IRON PO) Take 1 tablet by mouth daily with lunch.      rosuvastatin (CRESTOR) 20 MG tablet TAKE 1 TABLET(20 MG) BY MOUTH EVERY EVENING FOR CHOLESTEROL 90 tablet 3   tirzepatide (MOUNJARO) 2.5 MG/0.5ML Pen Inject 2.5 mg into the skin once a week. 2 mL 0   No current facility-administered medications on file prior to visit.    BP 118/68   Pulse 91   Temp 97.6 F (36.4 C) (Temporal)   Wt 238 lb (108 kg)   SpO2 98%   BMI 36.19 kg/m  Objective:   Physical Exam Cardiovascular:     Rate and Rhythm: Normal rate and regular rhythm.  Pulmonary:     Effort: Pulmonary effort is normal.     Breath sounds: Normal breath sounds.  Abdominal:     General: Bowel sounds are normal.     Palpations: Abdomen is soft.     Tenderness: There is no abdominal tenderness.  Musculoskeletal:     Cervical back: Neck supple.  Skin:    General: Skin is warm and dry.          Assessment & Plan:      This visit occurred during the SARS-CoV-2 public health emergency.  Safety protocols were in place, including screening questions prior to the visit,  additional usage of staff PPE, and extensive cleaning of exam room while observing appropriate contact time as indicated for disinfecting solutions.

## 2021-07-07 NOTE — Assessment & Plan Note (Signed)
18 pound weight loss since last visit, commended her on this!!!  She's had minimal side effects and is staying well hydrated with water.  Continue Mounjaro 2.5 mg weekly. Checking A1C and CMP today.

## 2021-07-07 NOTE — Assessment & Plan Note (Signed)
Repeat A1C pending today.  Continue with weight loss plan.

## 2021-07-07 NOTE — Patient Instructions (Signed)
Continue Mounjaro at 2.5 mg weekly.  Keep up the great work!  It was a pleasure to see you today!

## 2021-07-16 DIAGNOSIS — R7303 Prediabetes: Secondary | ICD-10-CM

## 2021-07-16 DIAGNOSIS — Z6838 Body mass index (BMI) 38.0-38.9, adult: Secondary | ICD-10-CM

## 2021-07-19 MED ORDER — TIRZEPATIDE 5 MG/0.5ML ~~LOC~~ SOAJ: 5.0000 mg | SUBCUTANEOUS | 1 refills | Status: DC

## 2021-08-03 ENCOUNTER — Telehealth: Payer: Self-pay | Admitting: Gastroenterology

## 2021-08-03 NOTE — Telephone Encounter (Signed)
Inbound call from pt stating that she needed to cxl her procedure. Thank you.

## 2021-08-03 NOTE — Telephone Encounter (Signed)
Called and left a message for call back. Called endo and cancel the procedure.

## 2021-08-04 ENCOUNTER — Ambulatory Visit
Admission: RE | Admit: 2021-08-04 | Payer: Managed Care, Other (non HMO) | Source: Home / Self Care | Admitting: Gastroenterology

## 2021-08-04 ENCOUNTER — Encounter: Admission: RE | Payer: Self-pay | Source: Home / Self Care

## 2021-08-04 SURGERY — COLONOSCOPY WITH PROPOFOL
Anesthesia: General

## 2021-08-18 ENCOUNTER — Other Ambulatory Visit: Payer: Self-pay

## 2021-08-18 ENCOUNTER — Encounter: Payer: Self-pay | Admitting: Primary Care

## 2021-08-18 ENCOUNTER — Ambulatory Visit (INDEPENDENT_AMBULATORY_CARE_PROVIDER_SITE_OTHER): Payer: Managed Care, Other (non HMO) | Admitting: Primary Care

## 2021-08-18 DIAGNOSIS — E6609 Other obesity due to excess calories: Secondary | ICD-10-CM | POA: Diagnosis not present

## 2021-08-18 DIAGNOSIS — I10 Essential (primary) hypertension: Secondary | ICD-10-CM | POA: Diagnosis not present

## 2021-08-18 DIAGNOSIS — Z6834 Body mass index (BMI) 34.0-34.9, adult: Secondary | ICD-10-CM | POA: Diagnosis not present

## 2021-08-18 MED ORDER — LOSARTAN POTASSIUM 25 MG PO TABS: 25.0000 mg | ORAL_TABLET | Freq: Every day | ORAL | 3 refills | Status: DC

## 2021-08-18 NOTE — Progress Notes (Signed)
Subjective:    Patient ID: Jessica Hampton, female    DOB: [birth year], 51 y.o.   MRN: [id number]  HPI  Jessica Hampton is a very pleasant 51 y.o. female with a history of hypertension, PCOS, gastric bypass surgery, CVA, prediabetes, obesity who presents today for follow up of obesity and hypertension.  1) Obesity/Prediabetes: Currently managed on Mounjaro 5 mg weekly. She was last evaluated about 6 weeks ago, follow up since initiation of Mounjaro 2.5 mg weekly, had lost 18 pounds! We continued her dose and asked her to follow up.  She messaged [place] via MyChart about 1 month ago, weight had plateau so we increased her dose to 5 mg weekly. Since then she's continued to notice weight loss. She's noticed constipation as a side effect which is not bothersome.   She is weighing herself on her home scale, she 31.8 pounds since October 2022.   Diet currently consists of:  Breakfast: Protein shake Lunch: Salad with protein Dinner: Chicken, salmon, sweet potatoes,  Snacks: [place] yogurt, granola, fruit.  Desserts: None.  Beverages: Protein shakes, water, coffee with creamer  Exercise: Walking 3-4 times weekly   Wt Readings from Last 3 Encounters:  08/18/21 227 lb 4 oz (103.1 kg)  07/07/21 238 lb (108 kg)  05/26/21 256 lb (116.1 kg)   BP Readings from Last 3 Encounters:  08/18/21 122/80  07/07/21 118/68  06/30/21 118/72    2) Essential Hypertension: Currently managed on losartan 25 mg daily. Is following with cardiology for BP. Is due for refills now. She denies chest pain, dizziness.     Review of Systems  Respiratory:  Negative for shortness of breath.   Cardiovascular:  Negative for chest pain.  Neurological:  Negative for dizziness and headaches.        Past Medical History:  Diagnosis Date   Acute CVA (cerebrovascular accident) ([place]) 10/04/2019   Anemia    Anxiety    Complication of anesthesia    Family history of adverse reaction to anesthesia    MOM NAUSEATED    Fibroids, intramural 07/14/2020   GERD (gastroesophageal reflux disease)    OCC   Hypertension    PONV (postoperative nausea and vomiting)    NAUSEATED    Social History   Socioeconomic History   Marital status: Married    Spouse name: Not on file   Number of children: Not on file   Years of education: Not on file   Highest education level: Not on file  Occupational History   Occupation: Dental Office  Tobacco Use   Smoking status: Never   Smokeless tobacco: Never  Vaping Use   Vaping Use: Never used  Substance and Sexual Activity   Alcohol use: No   Drug use: No   Sexual activity: Not on file  Other Topics Concern   Not on file  Social History Narrative   Lives with husband   Social Determinants of Health   Financial Resource Strain: Not on file  Food Insecurity: Not on file  Transportation Needs: Not on file  Physical Activity: Not on file  Stress: Not on file  Social Connections: Not on file  Intimate Partner Violence: Not on file    Past Surgical History:  Procedure Laterality Date   ABDOMINAL HYSTERECTOMY N/A 07/14/2020   Procedure: TOTAL HYSTERECTOMY ABDOMINAL;  Surgeon: Benjaman Kindler, MD;  Location: [facility];  Service: Gynecology;  Laterality: N/A;   APPLICATION OF WOUND VAC  07/14/2020   Procedure: APPLICATION OF WOUND VAC;  Surgeon: Benjaman Kindler, MD;  Location: [facility];  Service: Gynecology;;  serial# [id number]   CYSTOSCOPY N/A 07/14/2020   Procedure: Consuela Mimes;  Surgeon: Benjaman Kindler, MD;  Location: [facility];  Service: Gynecology;  Laterality: N/A;   LAPAROSCOPIC GASTRIC SLEEVE RESECTION WITH HIATAL HERNIA REPAIR N/A 10/02/2016   Procedure: LAPAROSCOPIC GASTRIC SLEEVE RESECTION WITH HIATAL HERNIA REPAIR;  Surgeon: Bonner Puna, MD;  Location: [facility];  Service: General;  Laterality: N/A;   LOOP RECORDER INSERTION N/A 11/19/2019   Procedure: LOOP RECORDER INSERTION;  Surgeon: Isaias Cowman, MD;  Location: [place] [facility];  Service:  Cardiovascular;  Laterality: N/A;   TEE WITHOUT CARDIOVERSION N/A 11/24/2019   Procedure: TRANSESOPHAGEAL ECHOCARDIOGRAM (TEE);  Surgeon: Teodoro Spray, MD;  Location: [facility];  Service: Cardiovascular;  Laterality: N/A;   TOTAL LAPAROSCOPIC HYSTERECTOMY WITH SALPINGECTOMY Bilateral 07/14/2020   Procedure: TOTAL LAPAROSCOPIC HYSTERECTOMY WITH BILATERAL SALPINGECTOMY;  Surgeon: Benjaman Kindler, MD;  Location: [facility];  Service: Gynecology;  Laterality: Bilateral;   TUBAL LIGATION  2004    Family History  Problem Relation Age of Onset   Hypertension Mother    Hyperlipidemia Father    Hypertension Father     Allergies  Allergen Reactions   Lisinopril Shortness Of Breath and Cough    No airway swelling.   Nsaids Other (See Comments)    History of gastric bypass     Current Outpatient Medications on File Prior to Visit  Medication Sig Dispense Refill   aspirin EC 325 MG tablet Take 325 mg by mouth daily with lunch.     buPROPion (WELLBUTRIN XL) 150 MG 24 hr tablet TAKE 1 TABLET(150 MG) BY MOUTH DAILY FOR ANXIETY OR DEPRESSION 90 tablet 2   FLUoxetine (PROZAC) 40 MG capsule Take 1 capsule (40 mg total) by mouth daily. For anxiety. 90 capsule 3   losartan (COZAAR) 25 MG tablet Take 25 mg by mouth daily.     Multiple Vitamins-Minerals (BARIATRIC MULTIVITAMINS/IRON PO) Take 1 tablet by mouth daily with lunch.      rosuvastatin (CRESTOR) 20 MG tablet TAKE 1 TABLET(20 MG) BY MOUTH EVERY EVENING FOR CHOLESTEROL 90 tablet 3   tirzepatide (MOUNJARO) 5 MG/0.5ML Pen Inject 5 mg into the skin once a week. 2 mL 1   No current facility-administered medications on file prior to visit.    BP 122/80 (BP Location: Left Arm, Patient Position: Sitting, Cuff Size: Large)    Pulse 78    Temp 97.6 F (36.4 C) (Temporal)    Ht 5\' 8"  (1.727 m)    Wt 227 lb 4 oz (103.1 kg)    LMP 07/04/2020 (Exact Date)    SpO2 97%    BMI 34.55 kg/m  Objective:   Physical Exam Cardiovascular:     Rate and Rhythm:  Normal rate and regular rhythm.  Pulmonary:     Effort: Pulmonary effort is normal.     Breath sounds: Normal breath sounds.  Musculoskeletal:     Cervical back: Neck supple.  Skin:    General: Skin is warm and dry.          Assessment & Plan:      This visit occurred during the SARS-CoV-2 public health emergency.  Safety protocols were in place, including screening questions prior to the visit, additional usage of staff PPE, and extensive cleaning of exam room while observing appropriate contact time as indicated for disinfecting solutions.

## 2021-08-18 NOTE — Assessment & Plan Note (Signed)
Agree to take over losartan 25 mg. Refills provided today.  Reviewed CMP from chart in December 2022.

## 2021-08-18 NOTE — Assessment & Plan Note (Signed)
Improving!! Weight loss of 11 pounds since last visit 6 weeks ago! Commended her on lifestyle changes and exercise!  Continue Mounjaro 5 mg weekly for obesity and prediabetes. Will check labs next visit.  She will notify when she is needing refills

## 2021-08-18 NOTE — Patient Instructions (Signed)
Schedule a follow up visit for 3 months.  It was a pleasure to see you today!

## 2021-08-25 ENCOUNTER — Ambulatory Visit: Payer: Managed Care, Other (non HMO) | Admitting: Primary Care

## 2021-08-31 DIAGNOSIS — R7303 Prediabetes: Secondary | ICD-10-CM

## 2021-09-01 NOTE — Telephone Encounter (Signed)
Message sent to patient to let know that you will review once in office and to call cardiology about bp readings followed by them for that. I tried to call as well but she did not answer left message to check my chart and to let our office know if any concerns.

## 2021-09-04 MED ORDER — TIRZEPATIDE 5 MG/0.5ML ~~LOC~~ SOAJ: 5.0000 mg | SUBCUTANEOUS | 1 refills | Status: DC

## 2021-09-11 NOTE — Telephone Encounter (Signed)
Called insurance they will submitted request now and let us know as soon as a response had been given.

## 2021-10-14 IMAGING — CT CT ANGIO HEAD
2 of 7 series · 8 of 33 positions shown · IV contrast (APPLIED)
Comparison: None.

CLINICAL DATA: Code stroke follow-up

EXAM:
CT ANGIOGRAPHY HEAD AND NECK
TECHNIQUE: Multidetector CT imaging of the head and neck was performed using
the standard protocol during bolus administration of intravenous
contrast. Multiplanar CT image reconstructions and MIPs were
obtained to evaluate the vascular anatomy. Carotid stenosis
measurements (when applicable) are obtained utilizing NASCET
criteria, using the distal internal carotid diameter as the
denominator.
CONTRAST:  75mL OMNIPAQUE IOHEXOL 350 MG/ML SOLN

[Series 4: cta head neck · axial · 0.72mm/px · z∈[-248,-136]mm · 2 of 169 slices shown]
[im 57/169  soft-tissue]
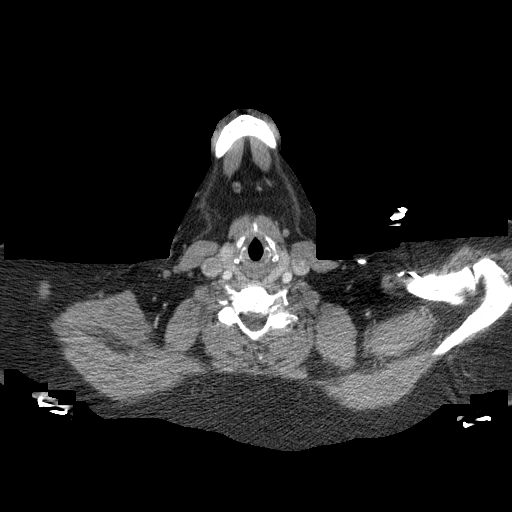
[im 113/169  soft-tissue]
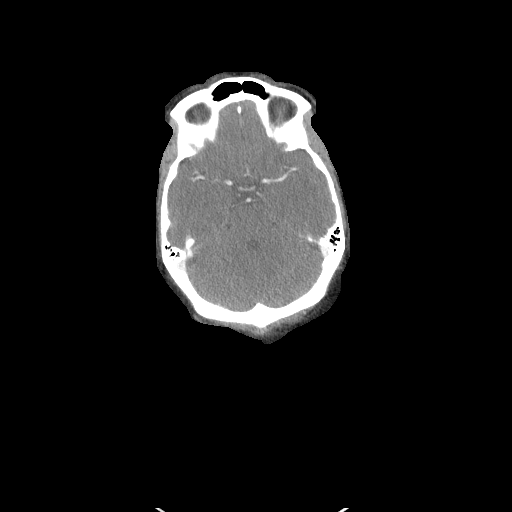

[Series 6: ax thin · axial · 0.43mm/px · z∈[-317,-78]mm · 6 of 335 slices shown]
[im 48/335  soft-tissue]
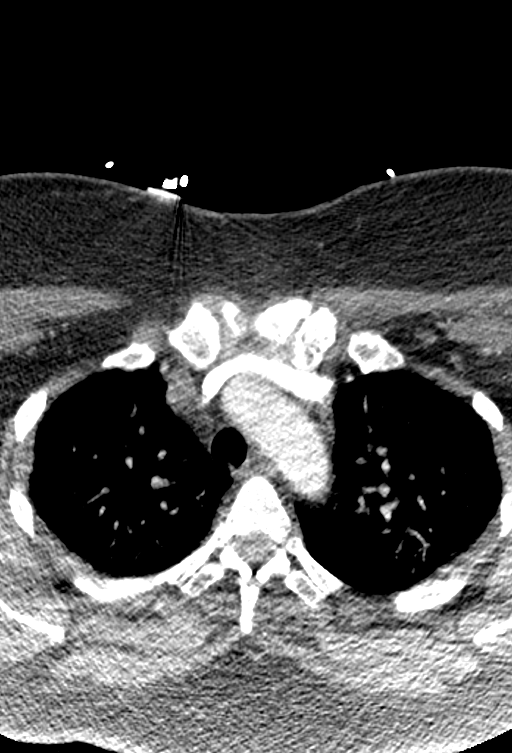
[im 96/335  bone]
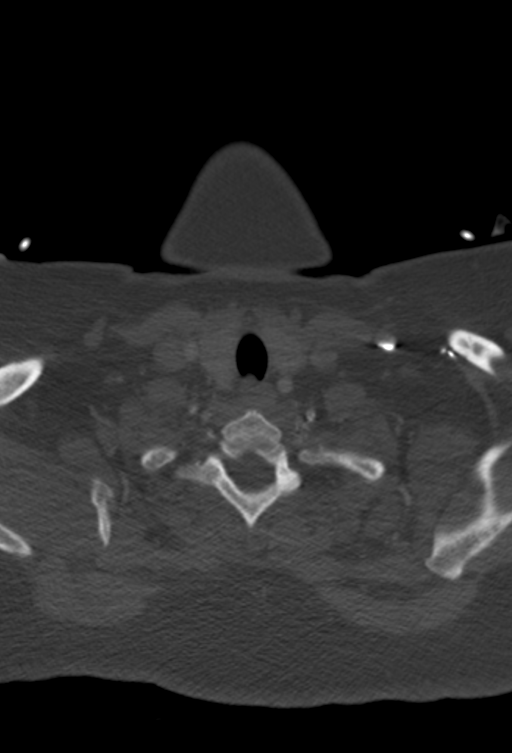
[im 144/335  soft-tissue]
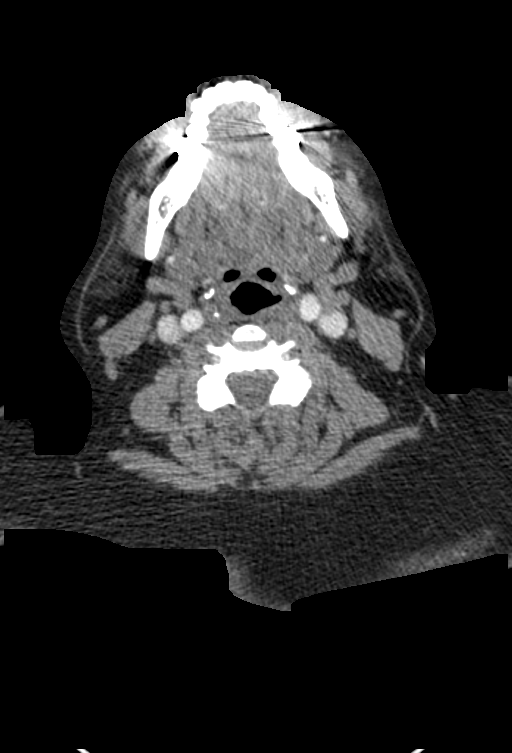
[im 191/335  bone]
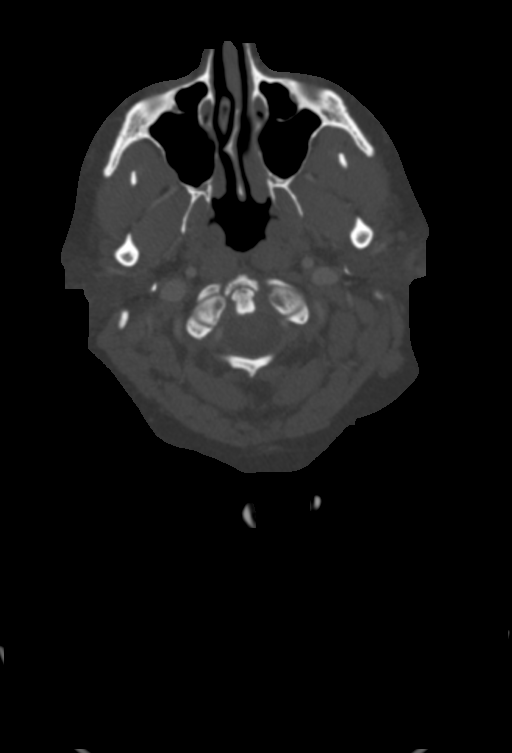
[im 239/335  soft-tissue]
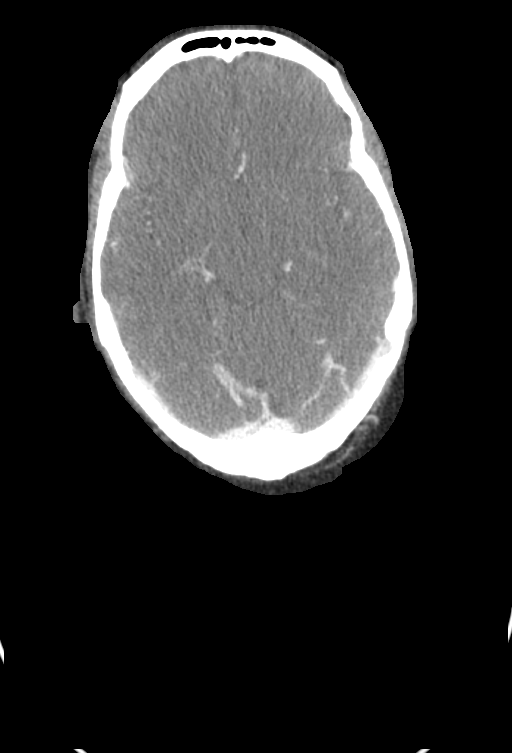
[im 287/335  bone]
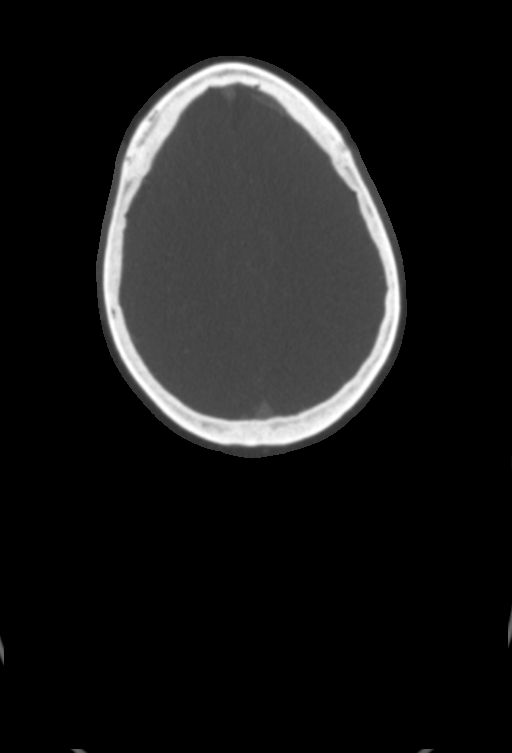

[8 of 33 positions shown; findings below may reference images not displayed]

FINDINGS: CTA NECK FINDINGS

Aortic arch: Great vessel origins are patent.

Right carotid system: Common, internal, and external carotid
arteries are patent. There is no measurable stenosis or evidence of
dissection.

Left carotid system: Common, internal, and external carotid arteries
are patent. There is no measurable stenosis or evidence of
dissection.

Vertebral arteries: Patent. No measurable stenosis or evidence of
dissection.

Skeleton: No significant abnormality.

Other neck: No mass or adenopathy.

Upper chest: No apical lung mass.

Review of the MIP images confirms the above findings

CTA HEAD FINDINGS

Anterior circulation: Intracranial internal carotid arteries are
patent with minor calcified plaque on the right. Anterior and middle
cerebral arteries are patent. There is a 5 mm segment of high-grade
stenosis of the right M1 MCA.

Posterior circulation: Intracranial vertebral arteries, basilar
artery, and posterior cerebral arteries are patent.

Venous sinuses: As permitted by contrast timing, patent.

Review of the MIP images confirms the above findings
IMPRESSION: Suboptimal contrast bolus timing.

No proximal intracranial vessel occlusion. Segmental high-grade
stenosis of the right M1 MCA.

No hemodynamically significant stenosis in the neck.

## 2021-10-14 IMAGING — CT CT ANGIO NECK
2 of 7 series · 8 of 33 positions shown · IV contrast (APPLIED)
Comparison: None.

CLINICAL DATA: Code stroke follow-up

EXAM:
CT ANGIOGRAPHY HEAD AND NECK
TECHNIQUE: Multidetector CT imaging of the head and neck was performed using
the standard protocol during bolus administration of intravenous
contrast. Multiplanar CT image reconstructions and MIPs were
obtained to evaluate the vascular anatomy. Carotid stenosis
measurements (when applicable) are obtained utilizing NASCET
criteria, using the distal internal carotid diameter as the
denominator.
CONTRAST:  75mL OMNIPAQUE IOHEXOL 350 MG/ML SOLN

[Series 4: cta head neck · axial · 0.72mm/px · z∈[-248,-136]mm · 2 of 169 slices shown]
[im 57/169  soft-tissue]
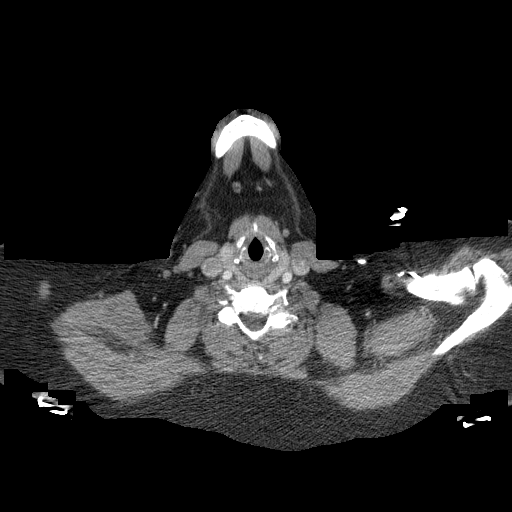
[im 113/169  soft-tissue]
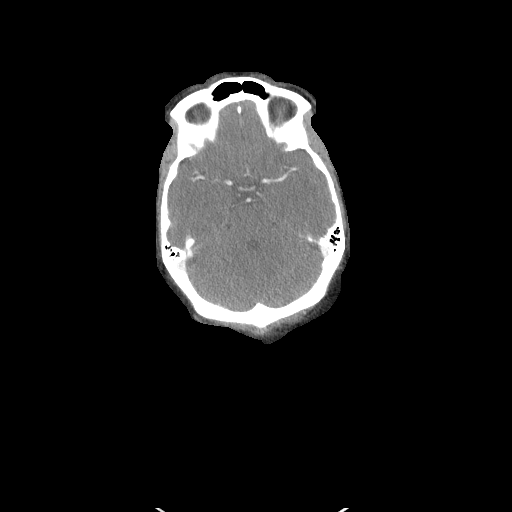

[Series 6: ax thin · axial · 0.43mm/px · z∈[-317,-78]mm · 6 of 335 slices shown]
[im 48/335  soft-tissue]
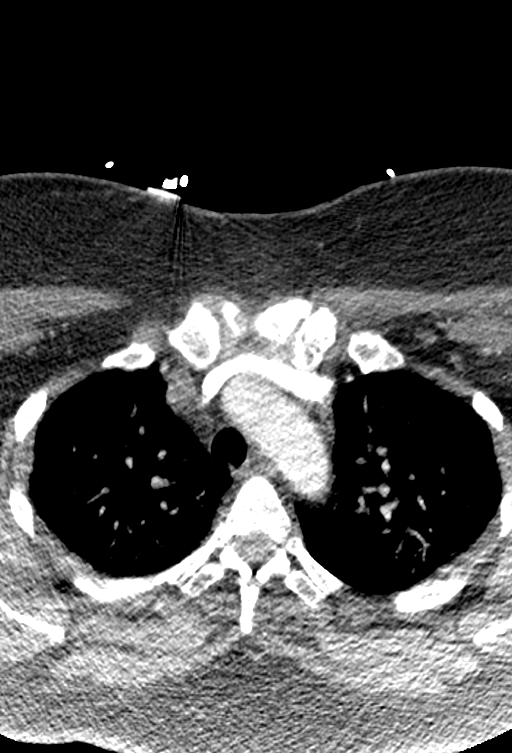
[im 96/335  bone]
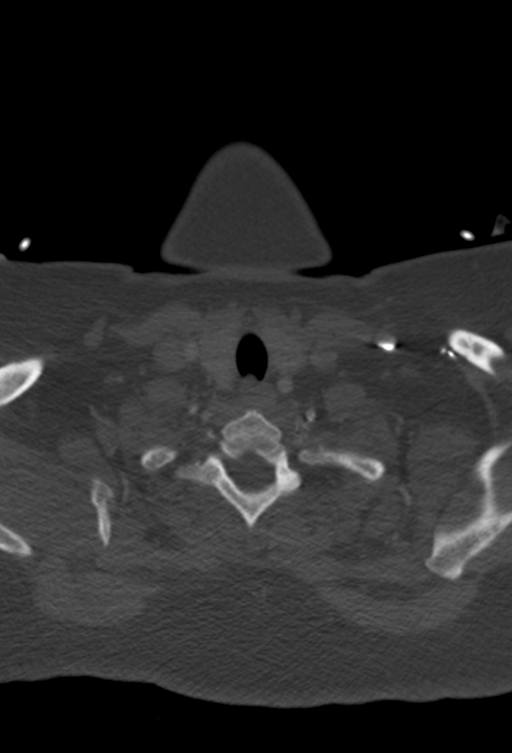
[im 144/335  soft-tissue]
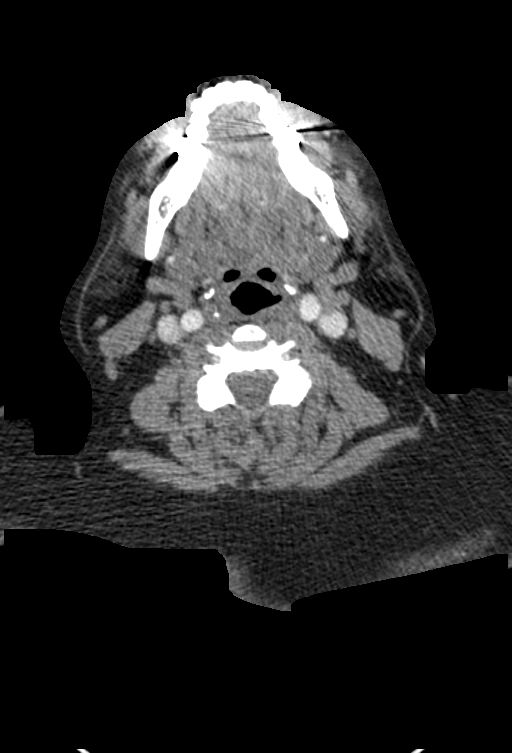
[im 191/335  bone]
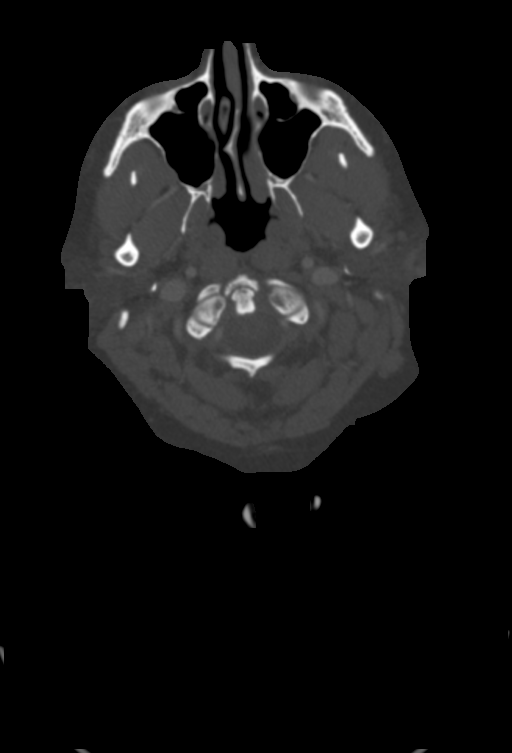
[im 239/335  soft-tissue]
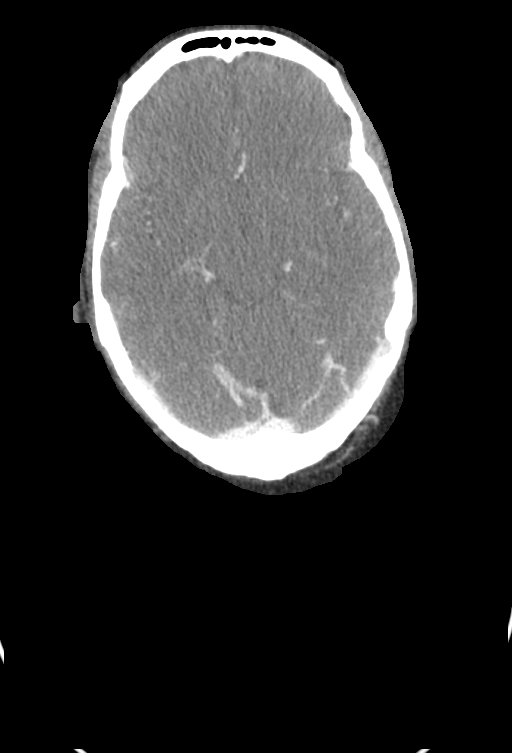
[im 287/335  bone]
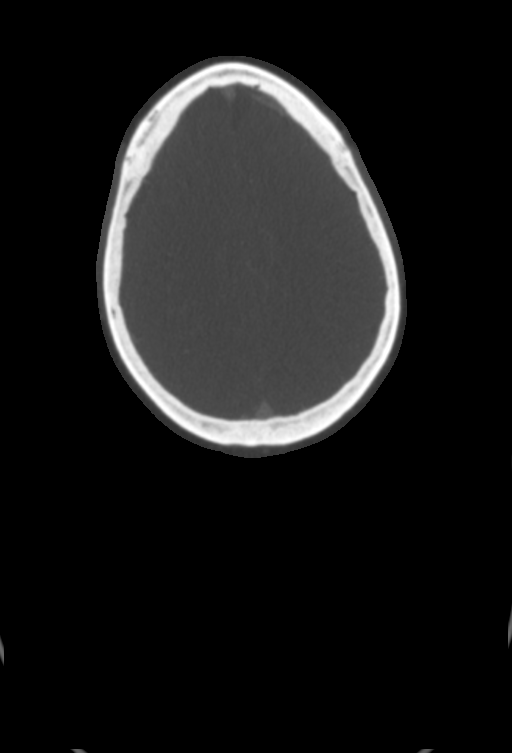

[8 of 33 positions shown; findings below may reference images not displayed]

FINDINGS: CTA NECK FINDINGS

Aortic arch: Great vessel origins are patent.

Right carotid system: Common, internal, and external carotid
arteries are patent. There is no measurable stenosis or evidence of
dissection.

Left carotid system: Common, internal, and external carotid arteries
are patent. There is no measurable stenosis or evidence of
dissection.

Vertebral arteries: Patent. No measurable stenosis or evidence of
dissection.

Skeleton: No significant abnormality.

Other neck: No mass or adenopathy.

Upper chest: No apical lung mass.

Review of the MIP images confirms the above findings

CTA HEAD FINDINGS

Anterior circulation: Intracranial internal carotid arteries are
patent with minor calcified plaque on the right. Anterior and middle
cerebral arteries are patent. There is a 5 mm segment of high-grade
stenosis of the right M1 MCA.

Posterior circulation: Intracranial vertebral arteries, basilar
artery, and posterior cerebral arteries are patent.

Venous sinuses: As permitted by contrast timing, patent.

Review of the MIP images confirms the above findings
IMPRESSION: Suboptimal contrast bolus timing.

No proximal intracranial vessel occlusion. Segmental high-grade
stenosis of the right M1 MCA.

No hemodynamically significant stenosis in the neck.

## 2021-10-21 ENCOUNTER — Other Ambulatory Visit: Payer: Self-pay | Admitting: Primary Care

## 2021-10-21 DIAGNOSIS — R7303 Prediabetes: Secondary | ICD-10-CM

## 2021-11-16 ENCOUNTER — Other Ambulatory Visit: Payer: Self-pay | Admitting: Primary Care

## 2021-11-16 DIAGNOSIS — Z6838 Body mass index (BMI) 38.0-38.9, adult: Secondary | ICD-10-CM

## 2021-11-16 DIAGNOSIS — R7303 Prediabetes: Secondary | ICD-10-CM

## 2021-11-17 ENCOUNTER — Ambulatory Visit: Payer: Managed Care, Other (non HMO) | Admitting: Primary Care

## 2021-12-01 ENCOUNTER — Ambulatory Visit: Payer: Managed Care, Other (non HMO) | Admitting: Primary Care

## 2021-12-14 ENCOUNTER — Other Ambulatory Visit: Payer: Self-pay | Admitting: Primary Care

## 2021-12-14 DIAGNOSIS — R7303 Prediabetes: Secondary | ICD-10-CM

## 2021-12-22 ENCOUNTER — Ambulatory Visit: Payer: Managed Care, Other (non HMO) | Admitting: Primary Care

## 2022-01-05 ENCOUNTER — Ambulatory Visit: Payer: Managed Care, Other (non HMO) | Admitting: Primary Care

## 2022-01-13 ENCOUNTER — Other Ambulatory Visit: Payer: Self-pay | Admitting: Primary Care

## 2022-01-13 DIAGNOSIS — R7303 Prediabetes: Secondary | ICD-10-CM

## 2022-01-26 ENCOUNTER — Ambulatory Visit (INDEPENDENT_AMBULATORY_CARE_PROVIDER_SITE_OTHER): Payer: Managed Care, Other (non HMO) | Admitting: Primary Care

## 2022-01-26 ENCOUNTER — Encounter: Payer: Self-pay | Admitting: Primary Care

## 2022-01-26 VITALS — BP 118/64 | HR 84 | Temp 98.6°F | Ht 68.0 in | Wt 197.0 lb

## 2022-01-26 DIAGNOSIS — Z8673 Personal history of transient ischemic attack (TIA), and cerebral infarction without residual deficits: Secondary | ICD-10-CM

## 2022-01-26 DIAGNOSIS — I10 Essential (primary) hypertension: Secondary | ICD-10-CM | POA: Diagnosis not present

## 2022-01-26 DIAGNOSIS — E663 Overweight: Secondary | ICD-10-CM

## 2022-01-26 DIAGNOSIS — R7303 Prediabetes: Secondary | ICD-10-CM

## 2022-01-26 DIAGNOSIS — Z6829 Body mass index (BMI) 29.0-29.9, adult: Secondary | ICD-10-CM

## 2022-01-26 DIAGNOSIS — Z23 Encounter for immunization: Secondary | ICD-10-CM

## 2022-01-26 DIAGNOSIS — Z6838 Body mass index (BMI) 38.0-38.9, adult: Secondary | ICD-10-CM

## 2022-01-26 LAB — HEMOGLOBIN A1C: Hgb A1c MFr Bld: 5.4 % (ref 4.6–6.5)

## 2022-01-26 LAB — COMPREHENSIVE METABOLIC PANEL
ALT: 29 U/L (ref 0–35)
AST: 21 U/L (ref 0–37)
Albumin: 4.7 g/dL (ref 3.5–5.2)
Alkaline Phosphatase: 53 U/L (ref 39–117)
BUN: 16 mg/dL (ref 6–23)
CO2: 33 mEq/L — ABNORMAL HIGH (ref 19–32)
Calcium: 9.4 mg/dL (ref 8.4–10.5)
Chloride: 101 mEq/L (ref 96–112)
Creatinine, Ser: 0.71 mg/dL (ref 0.40–1.20)
GFR: 99.61 mL/min (ref >60.00)
Glucose, Bld: 76 mg/dL (ref 70–99)
Potassium: 4.1 mEq/L (ref 3.5–5.1)
Sodium: 138 mEq/L (ref 135–145)
Total Bilirubin: 0.7 mg/dL (ref 0.2–1.2)
Total Protein: 7 g/dL (ref 6.0–8.3)

## 2022-01-26 LAB — LIPID PANEL
Cholesterol: 109 mg/dL (ref 0–200)
HDL: 60.3 mg/dL (ref >39.00)
LDL Cholesterol: 34 mg/dL (ref 0–99)
NonHDL: 48.42
Total CHOL/HDL Ratio: 2
Triglycerides: 71 mg/dL (ref 0.0–149.0)
VLDL: 14.2 mg/dL (ref 0.0–40.0)

## 2022-01-26 LAB — TSH: TSH: 1.08 u[IU]/mL (ref 0.35–5.50)

## 2022-01-26 MED ORDER — MOUNJARO 5 MG/0.5ML ~~LOC~~ SOAJ: SUBCUTANEOUS | 0 refills | Status: DC

## 2022-01-26 NOTE — Patient Instructions (Signed)
Stop by the lab prior to leaving today. I will notify you of your results once received.   Hold your rosuvastatin cholesterol medication for 2 weeks.  Please notify me if your cramping improves or not.  Continue Mounjaro 5 mg weekly.  Keep up the great work!!  It was a pleasure to see you today!

## 2022-01-26 NOTE — Progress Notes (Signed)
Subjective:    Patient ID: Jessica Hampton, female    DOB: 06/13/1971, 51 y.o.   MRN: [id number]  HPI  Jessica Hampton is a very pleasant 51 y.o. female with a history of hypertension, PCOS, CVA, GAD, prediabetes, obesity who presents today for follow-up of obesity.  Currently managed on Mounjaro 5 mg weekly for which was initiated in October 2022. She was last evaluated in January 2023, weight loss of 11 pounds, we continued her Mounjaro at 5 mg weekly.  Since her last visit she's lost 30 pounds! She is compliant to Mounjaro 5 mg weekly. She denies nausea, constipation. She is drinking 100 ounces water daily. She is walking 4-5 times weekly and has recently started lifting light weights. She is on a 1200-1400 calorie diet and has been limiting portion sizes.   Diet currently consists of:  Breakfast: Egg white omelette  Lunch: Veggies, fruit, protein, protein shake Dinner: Veggie or salad, protein  Snacks: None.  Desserts: None.  Beverages: Water, coffee  She is overall happy with her results. She does notice lower extremity cramping that occurs primarily at night. She is managed on statin therapy.  Wt Readings from Last 3 Encounters:  01/26/22 197 lb (89.4 kg)  08/18/21 227 lb 4 oz (103.1 kg)  07/07/21 238 lb (108 kg)   BP Readings from Last 3 Encounters:  01/26/22 118/64  08/18/21 122/80  07/07/21 118/68      Review of Systems  Respiratory:  Negative for shortness of breath.   Cardiovascular:  Negative for chest pain.  Gastrointestinal:  Negative for abdominal pain and nausea.  Musculoskeletal:  Positive for myalgias.  Neurological:  Negative for headaches.         Past Medical History:  Diagnosis Date   Acute CVA (cerebrovascular accident) ([place]) 10/04/2019   Anemia    Anxiety    Complication of anesthesia    Family history of adverse reaction to anesthesia    MOM NAUSEATED   Fibroids, intramural 07/14/2020   GERD (gastroesophageal reflux disease)    OCC    Hypertension    PONV (postoperative nausea and vomiting)    NAUSEATED    Social History   Socioeconomic History   Marital status: Married    Spouse name: Not on file   Number of children: Not on file   Years of education: Not on file   Highest education level: Not on file  Occupational History   Occupation: Dental Office  Tobacco Use   Smoking status: Never   Smokeless tobacco: Never  Vaping Use   Vaping Use: Never used  Substance and Sexual Activity   Alcohol use: No   Drug use: No   Sexual activity: Not on file  Other Topics Concern   Not on file  Social History Narrative   Lives with husband   Social Determinants of Health   Financial Resource Strain: Not on file  Food Insecurity: Not on file  Transportation Needs: Not on file  Physical Activity: Not on file  Stress: Not on file  Social Connections: Not on file  Intimate Partner Violence: Not on file    Past Surgical History:  Procedure Laterality Date   ABDOMINAL HYSTERECTOMY N/A 07/14/2020   Procedure: TOTAL HYSTERECTOMY ABDOMINAL;  Surgeon: Benjaman Kindler, MD;  Location: [facility];  Service: Gynecology;  Laterality: N/A;   APPLICATION OF WOUND VAC  07/14/2020   Procedure: APPLICATION OF WOUND VAC;  Surgeon: Benjaman Kindler, MD;  Location: [facility];  Service: Gynecology;;  serial# [id number]   CYSTOSCOPY N/A 07/14/2020   Procedure: CYSTOSCOPY;  Surgeon: Benjaman Kindler, MD;  Location: [facility];  Service: Gynecology;  Laterality: N/A;   LAPAROSCOPIC GASTRIC SLEEVE RESECTION WITH HIATAL HERNIA REPAIR N/A 10/02/2016   Procedure: LAPAROSCOPIC GASTRIC SLEEVE RESECTION WITH HIATAL HERNIA REPAIR;  Surgeon: Bonner Puna, MD;  Location: [facility];  Service: General;  Laterality: N/A;   LOOP RECORDER INSERTION N/A 11/19/2019   Procedure: LOOP RECORDER INSERTION;  Surgeon: Isaias Cowman, MD;  Location: [place] [facility];  Service: Cardiovascular;  Laterality: N/A;   TEE WITHOUT CARDIOVERSION N/A 11/24/2019    Procedure: TRANSESOPHAGEAL ECHOCARDIOGRAM (TEE);  Surgeon: Teodoro Spray, MD;  Location: [facility];  Service: Cardiovascular;  Laterality: N/A;   TOTAL LAPAROSCOPIC HYSTERECTOMY WITH SALPINGECTOMY Bilateral 07/14/2020   Procedure: TOTAL LAPAROSCOPIC HYSTERECTOMY WITH BILATERAL SALPINGECTOMY;  Surgeon: Benjaman Kindler, MD;  Location: [facility];  Service: Gynecology;  Laterality: Bilateral;   TUBAL LIGATION  2004    Family History  Problem Relation Age of Onset   Hypertension Mother    Hyperlipidemia Father    Hypertension Father     Allergies  Allergen Reactions   Lisinopril Shortness Of Breath and Cough    No airway swelling.   Nsaids Other (See Comments)    History of gastric bypass     Current Outpatient Medications on File Prior to Visit  Medication Sig Dispense Refill   aspirin EC 325 MG tablet Take 325 mg by mouth daily with lunch.     buPROPion (WELLBUTRIN XL) 150 MG 24 hr tablet TAKE 1 TABLET(150 MG) BY MOUTH DAILY FOR ANXIETY OR DEPRESSION 90 tablet 2   FLUoxetine (PROZAC) 40 MG capsule Take 1 capsule (40 mg total) by mouth daily. For anxiety. 90 capsule 3   losartan (COZAAR) 25 MG tablet Take 1 tablet (25 mg total) by mouth daily. For blood pressure. 90 tablet 3   Multiple Vitamins-Minerals (BARIATRIC MULTIVITAMINS/IRON PO) Take 1 tablet by mouth daily with lunch.      rosuvastatin (CRESTOR) 20 MG tablet TAKE 1 TABLET(20 MG) BY MOUTH EVERY EVENING FOR CHOLESTEROL 90 tablet 3   No current facility-administered medications on file prior to visit.    BP 118/64   Pulse 84   Temp 98.6 F (37 C) (Oral)   Ht '5\' 8"'$  (1.727 m)   Wt 197 lb (89.4 kg)   LMP 07/04/2020 (Exact Date)   SpO2 95%   BMI 29.95 kg/m  Objective:   Physical Exam Cardiovascular:     Rate and Rhythm: Normal rate and regular rhythm.  Pulmonary:     Effort: Pulmonary effort is normal.     Breath sounds: Normal breath sounds.  Musculoskeletal:     Cervical back: Neck supple.  Skin:    General: Skin  is warm and dry.  Psychiatric:        Mood and Affect: Mood normal.           Assessment & Plan:   Problem List Items Addressed This Visit       Cardiovascular and Mediastinum   HYPERTENSION, BENIGN ESSENTIAL   Relevant Orders   Comprehensive metabolic panel   TSH     Other   History of CVA (cerebrovascular accident)    Suspect rosuvastatin to be contributing to myalgias at night. Hold rosuvastatin x2 weeks, she will update.  Consider changing to pravastatin.      Relevant Orders   Lipid panel   Prediabetes    Commended her on weight loss  of 30 pounds!  Repeat A1c pending.      Relevant Medications   tirzepatide (MOUNJARO) 5 MG/0.5ML Pen   Other Relevant Orders   Hemoglobin A1c   Overweight with body mass index (BMI) of 29 to 29.9 in adult - Primary    Improved with weight loss of 30 pounds since last visit!  Commended her on dietary changes and the addition of exercise! Given her tremendous improvement we will continue with Mounjaro 5 mg weekly.  Checking labs today.  Follow-up in 3 months.      Relevant Medications   tirzepatide (MOUNJARO) 5 MG/0.5ML Pen   Other Visit Diagnoses     Class 2 severe obesity due to excess calories with serious comorbidity and body mass index (BMI) of 38.0 to 38.9 in adult [facility])       Relevant Medications   tirzepatide (MOUNJARO) 5 MG/0.5ML Pen       30 minutes was spent today with patient going over her diet and exercise routine, discussing plan moving forward.  See assessment and plan.   Pleas Koch, NP

## 2022-01-26 NOTE — Assessment & Plan Note (Signed)
Improved with weight loss of 30 pounds since last visit!  Commended her on dietary changes and the addition of exercise! Given her tremendous improvement we will continue with Mounjaro 5 mg weekly.  Checking labs today.  Follow-up in 3 months.

## 2022-01-26 NOTE — Addendum Note (Signed)
Addended by: Francella Solian on: 01/26/2022 10:23 AM   Modules accepted: Orders

## 2022-01-26 NOTE — Assessment & Plan Note (Signed)
Suspect rosuvastatin to be contributing to myalgias at night. Hold rosuvastatin x2 weeks, she will update.  Consider changing to pravastatin.

## 2022-01-26 NOTE — Assessment & Plan Note (Signed)
Commended her on weight loss of 30 pounds!  Repeat A1c pending.

## 2022-01-28 NOTE — Telephone Encounter (Signed)
Jessica Hampton, can you find out what is going on with her [facility] prescription?  I refilled it during her visit last week, but she is saying that my prescription is a "new prescription" and her coupon will not apply.

## 2022-01-29 NOTE — Telephone Encounter (Signed)
Called pharmacy insurance is now requesting a prior auth now on this medication. I have submitted it. Patient called and made aware. Of information. Informed it would be up to 72 business hours before we have response.

## 2022-02-01 NOTE — Telephone Encounter (Signed)
Please notify patient of this information.  Notify her that my diagnosis codes never changed and the prescription was refilled from the original 1.  Also explain what is going on with her insurance.  My question is, was her insurance paying any of the [facility] before?  It seems that she was only using the coupon card previously which was covering most of the cost except for $25.

## 2022-02-12 DIAGNOSIS — Z8673 Personal history of transient ischemic attack (TIA), and cerebral infarction without residual deficits: Secondary | ICD-10-CM

## 2022-02-12 DIAGNOSIS — Z6838 Body mass index (BMI) 38.0-38.9, adult: Secondary | ICD-10-CM

## 2022-02-12 DIAGNOSIS — R7303 Prediabetes: Secondary | ICD-10-CM

## 2022-02-12 DIAGNOSIS — E282 Polycystic ovarian syndrome: Secondary | ICD-10-CM

## 2022-02-15 MED ORDER — ROSUVASTATIN CALCIUM 5 MG PO TABS: 5.0000 mg | ORAL_TABLET | Freq: Every day | ORAL | 3 refills | Status: DC

## 2022-02-16 MED ORDER — MOUNJARO 5 MG/0.5ML ~~LOC~~ SOAJ: SUBCUTANEOUS | 0 refills | Status: DC

## 2022-02-19 ENCOUNTER — Telehealth: Payer: Self-pay

## 2022-02-19 NOTE — Telephone Encounter (Signed)
I went onto Cover My Meds to start PA on Mounjaro '5MG'$ /0.5ML pen-injectors.  CMM states this has been archived by [employer].  I called and spoke to [place] at [place].  She stated that because patient does not have diagnosis of diabetes, this will no longer be approved by her insurance.  Prediabetes is not a valid diagnosis for this medication to be approved.

## 2022-02-19 NOTE — Telephone Encounter (Signed)
Noted. Please notify patient of this information. Tell her that I tried to code this under her PCOS and prediabetes but they will not cover as she does not have diabetes.

## 2022-02-20 NOTE — Telephone Encounter (Signed)
I recommend she contact her insurance company to ask about [place] or [place].  She will need to tell them that she has prediabetes and PCOS but that she does not have type 2 diabetes.  Have her contact us when she finds out more information.

## 2022-02-20 NOTE — Telephone Encounter (Signed)
I called patient to notify her that this is not approved for prediabetes.  She asked if you could prescribe a similar drug that might get approved for her diagnosis.

## 2022-02-21 NOTE — Telephone Encounter (Signed)
Patient notified as instructed by telephone and verbalized understanding. Patient stated that she will contact her insurance company and will call back with what she finds out.

## 2022-02-21 NOTE — Telephone Encounter (Signed)
Pt spoke with insurance and Mancel Parsons does need PA. Said if it's marked Urgent they will process in 24 hours

## 2022-02-23 NOTE — Telephone Encounter (Signed)
Left message to return call to our office.  

## 2022-02-23 NOTE — Telephone Encounter (Signed)
Patient said she missed call and asked for a call back

## 2022-02-26 DIAGNOSIS — R7303 Prediabetes: Secondary | ICD-10-CM

## 2022-02-26 DIAGNOSIS — E663 Overweight: Secondary | ICD-10-CM

## 2022-02-26 NOTE — Telephone Encounter (Signed)
Patient is calling in again, wanting to know if we have started this PA.

## 2022-02-27 NOTE — Telephone Encounter (Signed)
Can you send in new script and let me know when done so I can start prior auth?

## 2022-02-28 MED ORDER — SEMAGLUTIDE-WEIGHT MANAGEMENT 0.5 MG/0.5ML ~~LOC~~ SOAJ: 0.5000 mg | SUBCUTANEOUS | 0 refills | Status: DC

## 2022-02-28 NOTE — Telephone Encounter (Signed)
Rx sent to pharmacy for [facility]. Notified patient of this via [place].

## 2022-02-28 NOTE — Addendum Note (Signed)
Addended by: Pleas Koch on: 02/28/2022 10:19 AM   Modules accepted: Orders

## 2022-03-01 ENCOUNTER — Other Ambulatory Visit: Payer: Self-pay | Admitting: Primary Care

## 2022-03-01 DIAGNOSIS — F411 Generalized anxiety disorder: Secondary | ICD-10-CM

## 2022-03-01 MED ORDER — WEGOVY 0.25 MG/0.5ML ~~LOC~~ SOAJ: 0.2500 mg | SUBCUTANEOUS | 0 refills | Status: DC

## 2022-03-01 NOTE — Addendum Note (Signed)
Addended by: Pleas Koch on: 03/01/2022 06:22 PM   Modules accepted: Orders

## 2022-03-01 NOTE — Addendum Note (Signed)
Addended by: Pleas Koch on: 03/01/2022 06:48 PM   Modules accepted: Orders

## 2022-03-04 NOTE — Telephone Encounter (Signed)
Jessica Hampton

## 2022-03-09 NOTE — Telephone Encounter (Signed)
Jessica Hampton, will you take a look?

## 2022-04-20 ENCOUNTER — Other Ambulatory Visit: Payer: Self-pay | Admitting: Primary Care

## 2022-04-20 DIAGNOSIS — F411 Generalized anxiety disorder: Secondary | ICD-10-CM

## 2022-04-27 ENCOUNTER — Ambulatory Visit: Payer: Managed Care, Other (non HMO) | Admitting: Primary Care

## 2022-05-18 ENCOUNTER — Ambulatory Visit: Payer: Managed Care, Other (non HMO) | Admitting: Primary Care

## 2022-05-25 ENCOUNTER — Other Ambulatory Visit: Payer: Self-pay | Admitting: Primary Care

## 2022-05-25 DIAGNOSIS — F411 Generalized anxiety disorder: Secondary | ICD-10-CM

## 2022-05-28 ENCOUNTER — Telehealth: Payer: Self-pay | Admitting: Primary Care

## 2022-05-28 ENCOUNTER — Other Ambulatory Visit: Payer: Self-pay

## 2022-05-28 ENCOUNTER — Telehealth: Payer: Self-pay

## 2022-05-28 DIAGNOSIS — Z1211 Encounter for screening for malignant neoplasm of colon: Secondary | ICD-10-CM

## 2022-05-28 MED ORDER — NA SULFATE-K SULFATE-MG SULF 17.5-3.13-1.6 GM/177ML PO SOLN: 1.0000 | Freq: Once | ORAL | 0 refills | Status: AC

## 2022-05-28 NOTE — Telephone Encounter (Signed)
Gastroenterology Pre-Procedure Review  Request Date: 06/29/22 Requesting Physician: Marius Ditch  PATIENT REVIEW QUESTIONS: The patient responded to the following health history questions as indicated:    1. Are you having any GI issues? no 2. Do you have a personal history of Polyps? no 3. Do you have a family history of Colon Cancer or Polyps? no 4. Diabetes Mellitus? no 5. Joint replacements in the past 12 months?no 6. Major health problems in the past 3 months?no 7. Any artificial heart valves, MVP, or defibrillator?no    MEDICATIONS & ALLERGIES:    Patient reports the following regarding taking any anticoagulation/antiplatelet therapy:   Plavix, Coumadin, Eliquis, Xarelto, Lovenox, Pradaxa, Brilinta, or Effient? no Aspirin? 325 mg daily prescription overseen by Alma Friendly blood thinner request sent  Patient confirms/reports the following medications:  Current Outpatient Medications  Medication Sig Dispense Refill   aspirin EC 325 MG tablet Take 325 mg by mouth daily with lunch.     buPROPion (WELLBUTRIN XL) 150 MG 24 hr tablet TAKE 1 TABLET(150 MG) BY MOUTH DAILY FOR ANXIETY OR DEPRESSION 90 tablet 0   FLUoxetine (PROZAC) 40 MG capsule TAKE 1 CAPSULE(40 MG) BY MOUTH DAILY FOR ANXIETY 90 capsule 0   losartan (COZAAR) 25 MG tablet Take 1 tablet (25 mg total) by mouth daily. For blood pressure. 90 tablet 3   Multiple Vitamins-Minerals (BARIATRIC MULTIVITAMINS/IRON PO) Take 1 tablet by mouth daily with lunch.      rosuvastatin (CRESTOR) 5 MG tablet Take 1 tablet (5 mg total) by mouth daily. for cholesterol. 90 tablet 3   No current facility-administered medications for this visit.    Patient confirms/reports the following allergies:  Allergies  Allergen Reactions   Lisinopril Shortness Of Breath and Cough    No airway swelling.   Nsaids Other (See Comments)    History of gastric bypass     No orders of the defined types were placed in this encounter.   AUTHORIZATION  INFORMATION Primary Insurance: 1D#: Group #:  Secondary Insurance: 1D#: Group #:  SCHEDULE INFORMATION: Date:  Time: Location:

## 2022-05-30 NOTE — Telephone Encounter (Signed)
Error

## 2022-06-01 ENCOUNTER — Telehealth: Payer: Self-pay

## 2022-06-01 NOTE — Telephone Encounter (Signed)
LVM for patient advising her to hold Aspirin '325mg'$  7 days prior to procedure.  May resume after procedure.  Advice received from completed blood thinner request faxed from NP Alma Friendly.on 05/31/22.  Thanks, [place], [place]

## 2022-06-15 ENCOUNTER — Ambulatory Visit (INDEPENDENT_AMBULATORY_CARE_PROVIDER_SITE_OTHER): Payer: Managed Care, Other (non HMO) | Admitting: Primary Care

## 2022-06-15 ENCOUNTER — Encounter: Payer: Self-pay | Admitting: Primary Care

## 2022-06-15 VITALS — BP 124/80 | HR 75 | Temp 97.3°F | Ht 68.0 in | Wt 215.0 lb

## 2022-06-15 DIAGNOSIS — I10 Essential (primary) hypertension: Secondary | ICD-10-CM | POA: Diagnosis not present

## 2022-06-15 DIAGNOSIS — F411 Generalized anxiety disorder: Secondary | ICD-10-CM | POA: Diagnosis not present

## 2022-06-15 DIAGNOSIS — R7303 Prediabetes: Secondary | ICD-10-CM | POA: Diagnosis not present

## 2022-06-15 DIAGNOSIS — Z6832 Body mass index (BMI) 32.0-32.9, adult: Secondary | ICD-10-CM

## 2022-06-15 DIAGNOSIS — Z23 Encounter for immunization: Secondary | ICD-10-CM | POA: Diagnosis not present

## 2022-06-15 DIAGNOSIS — E6609 Other obesity due to excess calories: Secondary | ICD-10-CM | POA: Diagnosis not present

## 2022-06-15 LAB — POCT GLYCOSYLATED HEMOGLOBIN (HGB A1C): Hemoglobin A1C: 5.3 % (ref 4.0–5.6)

## 2022-06-15 MED ORDER — METFORMIN HCL ER 500 MG PO TB24: 500.0000 mg | ORAL_TABLET | Freq: Every day | ORAL | 1 refills | Status: DC

## 2022-06-15 NOTE — Assessment & Plan Note (Signed)
Weight gain since off Mounjaro, but she is doing a great job in trying to keep from regaining.  Continue regular exercise. Continue to work on diet.  Add metformin XR 500 mg daily for weight loss and prediabetes.

## 2022-06-15 NOTE — Patient Instructions (Addendum)
Start metformin XR 500 mg daily in the morning with breakfast.  Keep exercising.   Please schedule a physical to meet with me in June 2024.   It was a pleasure to see you today!

## 2022-06-15 NOTE — Assessment & Plan Note (Signed)
Controlled.  Continue losartan 25 mg daily. CMP reviewed from June 2023

## 2022-06-15 NOTE — Progress Notes (Signed)
Subjective:    Patient ID: Jessica Hampton, female    DOB: Nov 20, 1970, 51 y.o.   MRN: [id number]  HPI  Jessica Hampton is a very pleasant 51 y.o. female with a history of hypertension, CVA, gastric bypass surgery, prediabetes who presents today for follow up of chronic conditions and to discuss obesity.  1) GAD: Currently managed on fluoxetine 40 mg daily, bupropion XL 150 mg daily. She's been under a lot of stress over the last year with her husbands surgery, her daughters health, and the passing of her dog. Despite her tough year, she feels well managed on her current regimen.   2) Essential Hypertension: Currently managed on losartan 25 mg daily. She denies chest pain, shortness of breath, headaches.   BP Readings from Last 3 Encounters:  06/15/22 124/80  01/26/22 118/64  08/18/21 122/80   3) Hyperlipidemia: Currently managed on rosuvastatin 5 mg daily.   4) Obesity: Previously managed on Mounjaro 5 mg weekly for which she took for about 6-9 months. Two months ago her coupon card would not work and insurance would not cover. While on Mounjaro she lost approximately 40 pounds.   She is exercising at the gym 3-4 days weekly. She is not snacking. She is still working to control portion sizes.   She is currently participating in [employer] which has been challenging for her.   She has a history of PCOS and prediabetes. She was once managed on metformin years ago. She tolerated this well. She would like to try metformin for weight loss again.   Wt Readings from Last 3 Encounters:  06/15/22 215 lb (97.5 kg)  01/26/22 197 lb (89.4 kg)  08/18/21 227 lb 4 oz (103.1 kg)      Review of Systems  Respiratory:  Negative for shortness of breath.   Cardiovascular:  Negative for chest pain.  Neurological:  Negative for headaches.  Psychiatric/Behavioral:  The patient is not nervous/anxious.          Past Medical History:  Diagnosis Date   Acute CVA (cerebrovascular accident)  ([place]) 10/04/2019   Anemia    Anxiety    Complication of anesthesia    Family history of adverse reaction to anesthesia    MOM NAUSEATED   Fibroids, intramural 07/14/2020   GERD (gastroesophageal reflux disease)    OCC   Hypertension    PONV (postoperative nausea and vomiting)    NAUSEATED    Social History   Socioeconomic History   Marital status: Married    Spouse name: Not on file   Number of children: Not on file   Years of education: Not on file   Highest education level: Not on file  Occupational History   Occupation: Dental Office  Tobacco Use   Smoking status: Never   Smokeless tobacco: Never  Vaping Use   Vaping Use: Never used  Substance and Sexual Activity   Alcohol use: No   Drug use: No   Sexual activity: Not on file  Other Topics Concern   Not on file  Social History Narrative   Lives with husband   Social Determinants of Health   Financial Resource Strain: Not on file  Food Insecurity: Not on file  Transportation Needs: Not on file  Physical Activity: Not on file  Stress: Not on file  Social Connections: Not on file  Intimate Partner Violence: Not on file    Past Surgical History:  Procedure Laterality Date   ABDOMINAL HYSTERECTOMY N/A 07/14/2020  Procedure: TOTAL HYSTERECTOMY ABDOMINAL;  Surgeon: Benjaman Kindler, MD;  Location: [facility];  Service: Gynecology;  Laterality: N/A;   APPLICATION OF WOUND VAC  07/14/2020   Procedure: APPLICATION OF WOUND VAC;  Surgeon: Benjaman Kindler, MD;  Location: [facility];  Service: Gynecology;;  serial# [id number]   CYSTOSCOPY N/A 07/14/2020   Procedure: Consuela Mimes;  Surgeon: Benjaman Kindler, MD;  Location: [facility];  Service: Gynecology;  Laterality: N/A;   LAPAROSCOPIC GASTRIC SLEEVE RESECTION WITH HIATAL HERNIA REPAIR N/A 10/02/2016   Procedure: LAPAROSCOPIC GASTRIC SLEEVE RESECTION WITH HIATAL HERNIA REPAIR;  Surgeon: Bonner Puna, MD;  Location: [facility];  Service: General;  Laterality: N/A;   LOOP RECORDER  INSERTION N/A 11/19/2019   Procedure: LOOP RECORDER INSERTION;  Surgeon: Isaias Cowman, MD;  Location: [place] [facility];  Service: Cardiovascular;  Laterality: N/A;   TEE WITHOUT CARDIOVERSION N/A 11/24/2019   Procedure: TRANSESOPHAGEAL ECHOCARDIOGRAM (TEE);  Surgeon: Teodoro Spray, MD;  Location: [facility];  Service: Cardiovascular;  Laterality: N/A;   TOTAL LAPAROSCOPIC HYSTERECTOMY WITH SALPINGECTOMY Bilateral 07/14/2020   Procedure: TOTAL LAPAROSCOPIC HYSTERECTOMY WITH BILATERAL SALPINGECTOMY;  Surgeon: Benjaman Kindler, MD;  Location: [facility];  Service: Gynecology;  Laterality: Bilateral;   TUBAL LIGATION  2004    Family History  Problem Relation Age of Onset   Hypertension Mother    Hyperlipidemia Father    Hypertension Father     Allergies  Allergen Reactions   Lisinopril Shortness Of Breath and Cough    No airway swelling.   Nsaids Other (See Comments)    History of gastric bypass     Current Outpatient Medications on File Prior to Visit  Medication Sig Dispense Refill   aspirin EC 325 MG tablet Take 325 mg by mouth daily with lunch.     buPROPion (WELLBUTRIN XL) 150 MG 24 hr tablet TAKE 1 TABLET(150 MG) BY MOUTH DAILY FOR ANXIETY OR DEPRESSION 90 tablet 0   FLUoxetine (PROZAC) 40 MG capsule TAKE 1 CAPSULE(40 MG) BY MOUTH DAILY FOR ANXIETY 90 capsule 0   losartan (COZAAR) 25 MG tablet Take 1 tablet (25 mg total) by mouth daily. For blood pressure. 90 tablet 3   Multiple Vitamins-Minerals (BARIATRIC MULTIVITAMINS/IRON PO) Take 1 tablet by mouth daily with lunch.      rosuvastatin (CRESTOR) 5 MG tablet Take 1 tablet (5 mg total) by mouth daily. for cholesterol. 90 tablet 3   No current facility-administered medications on file prior to visit.    BP 124/80   Pulse 75   Temp (!) 97.3 F (36.3 C) (Temporal)   Ht '5\' 8"'$  (1.727 m)   Wt 215 lb (97.5 kg)   LMP 07/04/2020 (Exact Date)   SpO2 100%   BMI 32.69 kg/m  Objective:   Physical Exam Cardiovascular:      Rate and Rhythm: Normal rate and regular rhythm.  Pulmonary:     Effort: Pulmonary effort is normal.     Breath sounds: Normal breath sounds.  Musculoskeletal:     Cervical back: Neck supple.  Skin:    General: Skin is warm and dry.  Psychiatric:        Mood and Affect: Mood normal.           Assessment & Plan:   Problem List Items Addressed This Visit       Cardiovascular and Mediastinum   HYPERTENSION, BENIGN ESSENTIAL    Controlled.  Continue losartan 25 mg daily. CMP reviewed from June 2023        Other  GAD (generalized anxiety disorder)    Overall controlled.  Continue fluoxetine 40 mg daily and bupropion Xl 150 mg daily.       Prediabetes    Controlled with A1C of 5.3 today.  Will initiate metformin XR 500 mg daily for obesity/PCOS.  Follow up in June 2024.      Relevant Orders   POCT glycosylated hemoglobin (Hb A1C)   Class 1 obesity due to excess calories with body mass index (BMI) of 32.0 to 32.9 in adult - Primary    Weight gain since off Mounjaro, but she is doing a great job in trying to keep from regaining.  Continue regular exercise. Continue to work on diet.  Add metformin XR 500 mg daily for weight loss and prediabetes.        Relevant Medications   metFORMIN (GLUCOPHAGE-XR) 500 MG 24 hr tablet       Pleas Koch, NP

## 2022-06-15 NOTE — Assessment & Plan Note (Signed)
Controlled with A1C of 5.3 today.  Will initiate metformin XR 500 mg daily for obesity/PCOS.  Follow up in June 2024.

## 2022-06-15 NOTE — Assessment & Plan Note (Signed)
Overall controlled.  Continue fluoxetine 40 mg daily and bupropion Xl 150 mg daily.

## 2022-06-28 ENCOUNTER — Encounter: Payer: Self-pay | Admitting: Gastroenterology

## 2022-06-29 ENCOUNTER — Ambulatory Visit: Payer: Managed Care, Other (non HMO) | Admitting: Anesthesiology

## 2022-06-29 ENCOUNTER — Encounter
Admission: RE | Disposition: A | Discharge status: Home / Self Care | Payer: Self-pay | Source: Home / Self Care | Attending: Gastroenterology

## 2022-06-29 ENCOUNTER — Ambulatory Visit
Admission: RE | Admit: 2022-06-29 | Discharge: 2022-06-29 | Disposition: A | Discharge status: Home / Self Care | Payer: Managed Care, Other (non HMO) | Attending: Gastroenterology | Admitting: Gastroenterology

## 2022-06-29 ENCOUNTER — Other Ambulatory Visit: Payer: Self-pay

## 2022-06-29 ENCOUNTER — Encounter: Payer: Self-pay | Admitting: Gastroenterology

## 2022-06-29 DIAGNOSIS — Z7984 Long term (current) use of oral hypoglycemic drugs: Secondary | ICD-10-CM | POA: Diagnosis not present

## 2022-06-29 DIAGNOSIS — I10 Essential (primary) hypertension: Secondary | ICD-10-CM | POA: Insufficient documentation

## 2022-06-29 DIAGNOSIS — Z1211 Encounter for screening for malignant neoplasm of colon: Secondary | ICD-10-CM | POA: Diagnosis not present

## 2022-06-29 DIAGNOSIS — Z9884 Bariatric surgery status: Secondary | ICD-10-CM | POA: Diagnosis not present

## 2022-06-29 DIAGNOSIS — K219 Gastro-esophageal reflux disease without esophagitis: Secondary | ICD-10-CM | POA: Diagnosis not present

## 2022-06-29 DIAGNOSIS — F419 Anxiety disorder, unspecified: Secondary | ICD-10-CM | POA: Insufficient documentation

## 2022-06-29 HISTORY — PX: COLONOSCOPY WITH PROPOFOL: SHX5780

## 2022-06-29 SURGERY — COLONOSCOPY WITH PROPOFOL
Anesthesia: General

## 2022-06-29 MED ORDER — PROPOFOL 10 MG/ML IV BOLUS
INTRAVENOUS | Status: DC | PRN
  Administered 2022-06-29: 20 mg via INTRAVENOUS
  Administered 2022-06-29: 60 mg via INTRAVENOUS

## 2022-06-29 MED ORDER — SODIUM CHLORIDE 0.9 % IV SOLN: INTRAVENOUS | Status: DC

## 2022-06-29 MED ORDER — PROPOFOL 1000 MG/100ML IV EMUL
INTRAVENOUS | Status: AC
  Filled 2022-06-29: qty 100

## 2022-06-29 MED ORDER — PROPOFOL 500 MG/50ML IV EMUL
INTRAVENOUS | Status: DC | PRN
  Administered 2022-06-29: 140 ug/kg/min via INTRAVENOUS

## 2022-06-29 MED ORDER — LIDOCAINE HCL (CARDIAC) PF 100 MG/5ML IV SOSY
PREFILLED_SYRINGE | INTRAVENOUS | Status: DC | PRN
  Administered 2022-06-29: 80 mg via INTRAVENOUS

## 2022-06-29 NOTE — Anesthesia Procedure Notes (Signed)
Date/Time: 06/29/2022 8:55 AM  Performed by: Agapito Hanway, [place], CRNAPre-anesthesia Checklist: Patient identified, Emergency Drugs available, Suction available, Patient being monitored and Timeout performed Patient Re-evaluated:Patient Re-evaluated prior to induction Oxygen Delivery Method: Nasal cannula Induction Type: IV induction

## 2022-06-29 NOTE — Op Note (Signed)
[facility] Gastroenterology Patient Name: Jessica Hampton Procedure Date: 06/29/2022 8:53 AM MRN: [id number] Account #: [id number] Date of Birth: May 09, 1971 Admit Type: Outpatient Age: 51 Room: [facility] ENDO ROOM 2 Gender: Female Note Status: Finalized Instrument Name: Colonoscope [id number] Procedure:             Colonoscopy Indications:           Screening for colorectal malignant neoplasm Providers:             Lin Landsman MD, MD Referring MD:          Pleas Koch (Referring MD) Medicines:             General Anesthesia Complications:         No immediate complications. Estimated blood loss: None. Procedure:             Pre-Anesthesia Assessment:                        - Prior to the procedure, a History and Physical was                         performed, and patient medications and allergies were                         reviewed. The patient is competent. The risks and                         benefits of the procedure and the sedation options and                         risks were discussed with the patient. All questions                         were answered and informed consent was obtained.                         Patient identification and proposed procedure were                         verified by the physician, the nurse, the                         anesthesiologist, the anesthetist and the technician                         in the pre-procedure area in the procedure room in the                         endoscopy suite. Mental Status Examination: alert and                         oriented. Airway Examination: normal oropharyngeal                         airway and neck mobility. Respiratory Examination:                         clear to auscultation. CV Examination: normal.  Prophylactic Antibiotics: The patient does not require                         prophylactic antibiotics. Prior Anticoagulants: The                          patient has taken no anticoagulant or antiplatelet                         agents. ASA Grade Assessment: III - A patient with                         severe systemic disease. After reviewing the risks and                         benefits, the patient was deemed in satisfactory                         condition to undergo the procedure. The anesthesia                         plan was to use general anesthesia. Immediately prior                         to administration of medications, the patient was                         re-assessed for adequacy to receive sedatives. The                         heart rate, respiratory rate, oxygen saturations,                         blood pressure, adequacy of pulmonary ventilation, and                         response to care were monitored throughout the                         procedure. The physical status of the patient was                         re-assessed after the procedure.                        After obtaining informed consent, the colonoscope was                         passed under direct vision. Throughout the procedure,                         the patient's blood pressure, pulse, and oxygen                         saturations were monitored continuously. The                         Colonoscope was introduced through the anus and  advanced to the the cecum, identified by appendiceal                         orifice and ileocecal valve. The colonoscopy was                         performed without difficulty. The patient tolerated                         the procedure well. The quality of the bowel                         preparation was evaluated using the BBPS [facility] Bowel                         Preparation Scale) with scores of: Right Colon = 3,                         Transverse Colon = 3 and Left Colon = 3 (entire mucosa                         seen well with no residual staining, small fragments                          of stool or opaque liquid). The total BBPS score                         equals 9. The ileocecal valve, appendiceal orifice,                         and rectum were photographed. Findings:      The perianal and digital rectal examinations were normal. Pertinent       negatives include normal sphincter tone and no palpable rectal lesions.      The entire examined colon appeared normal.      The retroflexed view of the distal rectum and anal verge was normal and       showed no anal or rectal abnormalities. Impression:            - The entire examined colon is normal.                        - The distal rectum and anal verge are normal on                         retroflexion view.                        - No specimens collected. Recommendation:        - Discharge patient to home (with escort).                        - Resume previous diet today.                        - Continue present medications.                        - Repeat colonoscopy in  10 years for screening                         purposes. Procedure Code(s):     --- Professional ---                        G8115, Colorectal cancer screening; colonoscopy on                         individual not meeting criteria for high risk Diagnosis Code(s):     --- Professional ---                        Z12.11, Encounter for screening for malignant neoplasm                         of colon CPT copyright 2022 American Medical Association. All rights reserved. The codes documented in this report are preliminary and upon coder review may  be revised to meet current compliance requirements. Dr. Ulyess Mort Lin Landsman MD, MD 06/29/2022 9:17:17 AM This report has been signed electronically. Number of Addenda: 0 Note Initiated On: 06/29/2022 8:53 AM Scope Withdrawal Time: 0 hours 11 minutes 56 seconds  Total Procedure Duration: 0 hours 17 minutes 29 seconds  Estimated Blood Loss:  Estimated blood loss: none.      [facility]

## 2022-06-29 NOTE — Anesthesia Postprocedure Evaluation (Signed)
Anesthesia Post Note  Patient: Jessica Hampton  Procedure(s) Performed: COLONOSCOPY WITH PROPOFOL  Patient location during evaluation: Endoscopy Anesthesia Type: General Level of consciousness: awake and alert Pain management: pain level controlled Vital Signs Assessment: post-procedure vital signs reviewed and stable Respiratory status: spontaneous breathing, nonlabored ventilation, respiratory function stable and patient connected to nasal cannula oxygen Cardiovascular status: blood pressure returned to baseline and stable Postop Assessment: no apparent nausea or vomiting Anesthetic complications: no   No notable events documented.   Last Vitals:  Vitals:   06/29/22 0930 06/29/22 0940  BP: 106/64 (!) 105/59  Pulse: 73 67  Resp: 20 16  Temp:    SpO2: 100% 100%    Last Pain:  Vitals:   06/29/22 0940  TempSrc:   PainSc: 0-No pain                 Precious Haws [name]

## 2022-06-29 NOTE — Anesthesia Preprocedure Evaluation (Signed)
Anesthesia Evaluation  Patient identified by MRN, date of birth, ID band Patient awake    Reviewed: Allergy & Precautions, NPO status , Patient's Chart, lab work & pertinent test results  History of Anesthesia Complications (+) PONV, Family history of anesthesia reaction and history of anesthetic complications  Airway Mallampati: III  TM Distance: >3 FB Neck ROM: full    Dental  (+) Chipped   Pulmonary neg pulmonary ROS, neg shortness of breath   Pulmonary exam normal        Cardiovascular Exercise Tolerance: Good hypertension, negative cardio ROS Normal cardiovascular exam     Neuro/Psych  PSYCHIATRIC DISORDERS      CVA    GI/Hepatic Neg liver ROS,GERD  Controlled,,  Endo/Other  negative endocrine ROS    Renal/GU negative Renal ROS  negative genitourinary   Musculoskeletal   Abdominal   Peds  Hematology negative hematology ROS (+)   Anesthesia Other Findings Past Medical History: 10/04/2019: Acute CVA (cerebrovascular accident) ([place]) No date: Anemia No date: Anxiety No date: Complication of anesthesia No date: Family history of adverse reaction to anesthesia     Comment:  MOM NAUSEATED 07/14/2020: Fibroids, intramural No date: GERD (gastroesophageal reflux disease)     Comment:  OCC No date: Hypertension No date: PONV (postoperative nausea and vomiting)     Comment:  NAUSEATED  Past Surgical History: 07/14/2020: ABDOMINAL HYSTERECTOMY; N/A     Comment:  Procedure: TOTAL HYSTERECTOMY ABDOMINAL;  Surgeon:               Benjaman Kindler, MD;  Location: [facility];  Service:               Gynecology;  Laterality: N/A; 94/17/4081: APPLICATION OF WOUND VAC     Comment:  Procedure: APPLICATION OF WOUND VAC;  Surgeon: Benjaman Kindler, MD;  Location: [facility];  Service: Gynecology;;               serial# [id number] 07/14/2020: CYSTOSCOPY; N/A     Comment:  Procedure: CYSTOSCOPY;  Surgeon: Benjaman Kindler, MD;                Location: [facility];  Service: Gynecology;  Laterality:               N/A; 10/02/2016: LAPAROSCOPIC GASTRIC SLEEVE RESECTION WITH HIATAL HERNIA  REPAIR; N/A     Comment:  Procedure: LAPAROSCOPIC GASTRIC SLEEVE RESECTION WITH               HIATAL HERNIA REPAIR;  Surgeon: Bonner Puna, MD;                Location: [facility];  Service: General;  Laterality: N/A; 11/19/2019: LOOP RECORDER INSERTION; N/A     Comment:  Procedure: LOOP RECORDER INSERTION;  Surgeon: Isaias Cowman, MD;  Location: [place] [facility];  Service:              Cardiovascular;  Laterality: N/A; 11/24/2019: TEE WITHOUT CARDIOVERSION; N/A     Comment:  Procedure: TRANSESOPHAGEAL ECHOCARDIOGRAM (TEE);                Surgeon: Teodoro Spray, MD;  Location: [facility];                Service: Cardiovascular;  Laterality: N/A; 07/14/2020: TOTAL LAPAROSCOPIC HYSTERECTOMY WITH SALPINGECTOMY;  Bilateral  Comment:  Procedure: TOTAL LAPAROSCOPIC HYSTERECTOMY WITH               BILATERAL SALPINGECTOMY;  Surgeon: Benjaman Kindler, MD;               Location: [facility];  Service: Gynecology;  Laterality:               Bilateral; 2004: TUBAL LIGATION  BMI    Body Mass Index: 33.05 kg/m      Reproductive/Obstetrics negative OB ROS                             Anesthesia Physical Anesthesia Plan  ASA: 3  Anesthesia Plan: General   Post-op Pain Management:    Induction: Intravenous  PONV Risk Score and Plan: Propofol infusion and TIVA  Airway Management Planned: Natural Airway and Nasal Cannula  Additional Equipment:   Intra-op Plan:   Post-operative Plan:   Informed Consent: I have reviewed the patients History and Physical, chart, labs and discussed the procedure including the risks, benefits and alternatives for the proposed anesthesia with the patient or authorized representative who has indicated his/her understanding and acceptance.      Dental Advisory Given  Plan Discussed with: Anesthesiologist, CRNA and Surgeon  Anesthesia Plan Comments: (Patient consented for risks of anesthesia including but not limited to:  - adverse reactions to medications - risk of airway placement if required - damage to eyes, teeth, lips or other oral mucosa - nerve damage due to positioning  - sore throat or hoarseness - Damage to heart, brain, nerves, lungs, other parts of body or loss of life  Patient voiced understanding.)       Anesthesia Quick Evaluation

## 2022-06-29 NOTE — Transfer of Care (Signed)
Immediate Anesthesia Transfer of Care Note  Patient: Jessica Hampton  Procedure(s) Performed: COLONOSCOPY WITH PROPOFOL  Patient Location: PACU and Endoscopy Unit  Anesthesia Type:General  Level of Consciousness: drowsy  Airway & Oxygen Therapy: Patient Spontanous Breathing  Post-op Assessment: Report given to RN and Post -op Vital signs reviewed and stable  Post vital signs: Reviewed and stable  Last Vitals:  Vitals Value Taken Time  BP 95/56   Temp 96.4   Pulse 70   Resp 11   SpO2 96     Last Pain:  Vitals:   06/29/22 0825  TempSrc: Temporal  PainSc: 0-No pain         Complications: No notable events documented.

## 2022-06-29 NOTE — H&P (Signed)
Cephas Darby, MD [address]  [facility]  [place], [place] [address]  Main: [phone]  Fax: [phone] Pager: [phone]  Primary Care Physician:  Pleas Koch, NP Primary Gastroenterologist:  Dr. Cephas Darby  Pre-Procedure History & Physical: HPI:  Jessica Hampton is a 51 y.o. female is here for an colonoscopy.   Past Medical History:  Diagnosis Date   Acute CVA (cerebrovascular accident) ([place]) 10/04/2019   Anemia    Anxiety    Complication of anesthesia    Family history of adverse reaction to anesthesia    MOM NAUSEATED   Fibroids, intramural 07/14/2020   GERD (gastroesophageal reflux disease)    OCC   Hypertension    PONV (postoperative nausea and vomiting)    NAUSEATED    Past Surgical History:  Procedure Laterality Date   ABDOMINAL HYSTERECTOMY N/A 07/14/2020   Procedure: TOTAL HYSTERECTOMY ABDOMINAL;  Surgeon: Benjaman Kindler, MD;  Location: [facility];  Service: Gynecology;  Laterality: N/A;   APPLICATION OF WOUND VAC  07/14/2020   Procedure: APPLICATION OF WOUND VAC;  Surgeon: Benjaman Kindler, MD;  Location: [facility];  Service: Gynecology;;  serial# [id number]   CYSTOSCOPY N/A 07/14/2020   Procedure: Consuela Mimes;  Surgeon: Benjaman Kindler, MD;  Location: [facility];  Service: Gynecology;  Laterality: N/A;   LAPAROSCOPIC GASTRIC SLEEVE RESECTION WITH HIATAL HERNIA REPAIR N/A 10/02/2016   Procedure: LAPAROSCOPIC GASTRIC SLEEVE RESECTION WITH HIATAL HERNIA REPAIR;  Surgeon: Bonner Puna, MD;  Location: [facility];  Service: General;  Laterality: N/A;   LOOP RECORDER INSERTION N/A 11/19/2019   Procedure: LOOP RECORDER INSERTION;  Surgeon: Isaias Cowman, MD;  Location: [facility];  Service: Cardiovascular;  Laterality: N/A;   TEE WITHOUT CARDIOVERSION N/A 11/24/2019   Procedure: TRANSESOPHAGEAL ECHOCARDIOGRAM (TEE);  Surgeon: Teodoro Spray, MD;  Location: [facility];  Service: Cardiovascular;  Laterality: N/A;   TOTAL LAPAROSCOPIC  HYSTERECTOMY WITH SALPINGECTOMY Bilateral 07/14/2020   Procedure: TOTAL LAPAROSCOPIC HYSTERECTOMY WITH BILATERAL SALPINGECTOMY;  Surgeon: Benjaman Kindler, MD;  Location: [facility];  Service: Gynecology;  Laterality: Bilateral;   TUBAL LIGATION  2004    Prior to Admission medications   Medication Sig Start Date End Date Taking? Authorizing Provider  aspirin EC 325 MG tablet Take 325 mg by mouth daily with lunch.   Yes [provider]  buPROPion (WELLBUTRIN XL) 150 MG 24 hr tablet TAKE 1 TABLET(150 MG) BY MOUTH DAILY FOR ANXIETY OR DEPRESSION 05/25/22  Yes Pleas Koch, NP  FLUoxetine (PROZAC) 40 MG capsule TAKE 1 CAPSULE(40 MG) BY MOUTH DAILY FOR ANXIETY 04/20/22  Yes Pleas Koch, NP  losartan (COZAAR) 25 MG tablet Take 1 tablet (25 mg total) by mouth daily. For blood pressure. 08/18/21  Yes Pleas Koch, NP  metFORMIN (GLUCOPHAGE-XR) 500 MG 24 hr tablet Take 1 tablet (500 mg total) by mouth daily with breakfast. 06/15/22  Yes Pleas Koch, NP  Multiple Vitamins-Minerals (BARIATRIC MULTIVITAMINS/IRON PO) Take 1 tablet by mouth daily with lunch.    Yes [provider]  rosuvastatin (CRESTOR) 5 MG tablet Take 1 tablet (5 mg total) by mouth daily. for cholesterol. 02/15/22  Yes Pleas Koch, NP    Allergies as of 05/28/2022 - Review Complete 01/26/2022  Allergen Reaction Noted   Lisinopril Shortness Of Breath and Cough 06/29/2020   Nsaids Other (See Comments) 10/04/2019    Family History  Problem Relation Age of Onset   Hypertension Mother    Hyperlipidemia Father    Hypertension  Father     Social History   Socioeconomic History   Marital status: Married    Spouse name: Not on file   Number of children: Not on file   Years of education: Not on file   Highest education level: Not on file  Occupational History   Occupation: Dental Office  Tobacco Use   Smoking status: Never   Smokeless tobacco: Never  Vaping Use   Vaping Use: Never  used  Substance and Sexual Activity   Alcohol use: No   Drug use: No   Sexual activity: Not on file  Other Topics Concern   Not on file  Social History Narrative   Lives with husband   Social Determinants of Health   Financial Resource Strain: Not on file  Food Insecurity: Not on file  Transportation Needs: Not on file  Physical Activity: Not on file  Stress: Not on file  Social Connections: Not on file  Intimate Partner Violence: Not on file    Review of Systems: See HPI, otherwise negative ROS  Physical Exam: BP 110/80   Pulse 75   Temp (!) 96.2 F (35.7 C) (Temporal)   Resp 16   Ht '5\' 7"'$  (1.702 m)   Wt 95.7 kg   LMP 07/04/2020 (Exact Date)   SpO2 95%   BMI 33.05 kg/m  General:   Alert,  pleasant and cooperative in NAD Head:  Normocephalic and atraumatic. Neck:  Supple; no masses or thyromegaly. Lungs:  Clear throughout to auscultation.    Heart:  Regular rate and rhythm. Abdomen:  Soft, nontender and nondistended. Normal bowel sounds, without guarding, and without rebound.   Neurologic:  Alert and  oriented x4;  grossly normal neurologically.  Impression/Plan: Jessica Hampton is here for an colonoscopy to be performed for colon cancer screening  Risks, benefits, limitations, and alternatives regarding  colonoscopy have been reviewed with the patient.  Questions have been answered.  All parties agreeable.   Sherri Sear, MD  06/29/2022, 8:44 AM

## 2022-07-02 DIAGNOSIS — E6609 Other obesity due to excess calories: Secondary | ICD-10-CM

## 2022-07-05 MED ORDER — TOPIRAMATE 25 MG PO TABS: 25.0000 mg | ORAL_TABLET | Freq: Every day | ORAL | 0 refills | Status: DC

## 2022-07-05 NOTE — Telephone Encounter (Signed)
Please see the MyChart message reply(ies) for my assessment and plan.  The patient gave consent for this Medical Advice Message and is aware that it may result in a bill to their insurance company as well as the possibility that this may result in a co-payment or deductible. They are an established patient, but are not seeking medical advice exclusively about a problem treated during an in person or video visit in the last 7 days. I did not recommend an in person or video visit within 7 days of my reply.  I spent a total of 15 minutes cumulative time within 7 days through MyChart messaging Kathey Simer K Sotiria Keast, NP  

## 2022-07-10 ENCOUNTER — Other Ambulatory Visit: Payer: Self-pay | Admitting: Primary Care

## 2022-07-10 DIAGNOSIS — F411 Generalized anxiety disorder: Secondary | ICD-10-CM

## 2022-07-17 MED ORDER — ZEPBOUND 2.5 MG/0.5ML ~~LOC~~ SOAJ: 2.5000 mg | SUBCUTANEOUS | 0 refills | Status: DC

## 2022-07-17 NOTE — Addendum Note (Signed)
Addended by: Pleas Koch on: 07/17/2022 12:36 PM   Modules accepted: Orders

## 2022-07-18 ENCOUNTER — Other Ambulatory Visit (HOSPITAL_COMMUNITY): Payer: Self-pay

## 2022-07-18 ENCOUNTER — Telehealth: Payer: Self-pay

## 2022-07-18 NOTE — Telephone Encounter (Signed)
Pharmacy Patient Advocate Encounter   Received notification from [facility] that prior authorization for Zepbound 2.'5mg'$ /0.60m is required/requested.  Per Test Claim: Orlistat, Wegovy, Qsymia, and Saxenda are Covered.   Please advise

## 2022-07-18 NOTE — Telephone Encounter (Signed)
Please complete prior authorization.

## 2022-07-20 ENCOUNTER — Telehealth: Payer: Self-pay

## 2022-07-20 DIAGNOSIS — E6609 Other obesity due to excess calories: Secondary | ICD-10-CM

## 2022-07-20 NOTE — Telephone Encounter (Signed)
  Anda Kraft, this is as big as I could blow it up... can you read?

## 2022-07-20 NOTE — Telephone Encounter (Signed)
Hello, these are the questions that will need to be answered, before submitting the PA.My apologies for the small font. I hope this helps    1)The patient's drug benefit plan provides coverage for other drugs which may be considered for treating your patient. Can your patient be treated with a formulary drug? Available Formulary Alternatives: orlistat, QSYMIA, SAXENDA, WEGOVY [NOTE: If yes, provide your patient with a new prescription for the formulary product.]   2)Is the patient unable to take the required number of formulary alternatives for the given diagnosis due to a trial and inadequate treatment response or intolerance, or a contraindication? Available Formulary Alternatives: 3 in a class with 3 or more alternatives,orlistat, QSYMIA, SAXENDA, WEGOVY [NOTE: If yes, then documentation is required for approval.] Provide documentation including name of medication(s) tried and clinical explanation for treatment failure(s), intolerance, and/or contraindication (whichever are applicable) for each medication and minimum number of required formulary alternatives:

## 2022-07-20 NOTE — Telephone Encounter (Signed)
  Please advise on both questions listed for the continuation of this PA.

## 2022-07-20 NOTE — Telephone Encounter (Signed)
The patient has not tried [place].  Is her insurance requiring her to try this first?  I believe this has been on national backorder for a while.

## 2022-07-24 MED ORDER — PEN NEEDLES 31G X 6 MM MISC: 0 refills | Status: DC

## 2022-07-24 MED ORDER — SAXENDA 18 MG/3ML ~~LOC~~ SOPN: PEN_INJECTOR | SUBCUTANEOUS | 0 refills | Status: DC

## 2022-07-24 NOTE — Telephone Encounter (Signed)
Noted, Rx for Saxenda daily injections sent to pharmacy She will start with 0.6 mg daily x 1 week, then increase to 1.2 mg daily thereafter. I also sent in a Rx for pen needles that will attach to the end of the pen.

## 2022-07-24 NOTE — Telephone Encounter (Signed)
Please call patient:  Notify her that insurance will not cover Zepbound unless she tries one of the medications recommend. The only two I prescribe from that list is Mancel Parsons (weekly injection) or Saxenda (daily injection).  Does she want to try one of those?

## 2022-07-24 NOTE — Telephone Encounter (Addendum)
No, Any of the medications listed below would have to be tried/failed ,not just the wegovy. Before an approval would likely occur for the Zepbound.

## 2022-07-24 NOTE — Telephone Encounter (Signed)
Unable to reach patient. Left voicemail to return call to our office.   

## 2022-07-24 NOTE — Telephone Encounter (Signed)
Patient has been notified rx was sent in and given directions for use. Patient verbalized understanding.

## 2022-07-24 NOTE — Telephone Encounter (Signed)
Patient has been notified that insurance will not cover Zepbound. She would like to try the Saxenda daily injections.

## 2022-07-25 ENCOUNTER — Telehealth: Payer: Self-pay

## 2022-07-25 NOTE — Telephone Encounter (Signed)
Pharmacy Patient Advocate Encounter   Received notification from [facility] that prior authorization for Wegovy 0.'5mg'$ /0.68m is required/requested.   PA submitted on 07/25/22 to (ins) [employer] via CEstillStatus is pending

## 2022-07-25 NOTE — Telephone Encounter (Signed)
Pharmacy Patient Advocate Encounter  Prior Authorization for Jessica Hampton has been approved.     Effective dates: 07/25/2022 through 02/20/2023

## 2022-07-25 NOTE — Telephone Encounter (Signed)
Patient called back in and stated that Saxenda is on backorder through [place] and [facility] through the end of Janurary.  [facility] stated they have the Wegovy 0.25 and 0.5 back in stock now through their mail order service and would like to get this sent in. She was given a number for [place] to call with the verbal order which they stated was the fastest way to get it ordered [phone].

## 2022-07-26 NOTE — Addendum Note (Signed)
Addended by: Pleas Koch on: 07/26/2022 01:17 PM   Modules accepted: Orders

## 2022-07-26 NOTE — Telephone Encounter (Signed)
See My Chart message. Patient starting on Wegovy now.

## 2022-08-09 ENCOUNTER — Other Ambulatory Visit: Payer: Self-pay | Admitting: Primary Care

## 2022-08-09 DIAGNOSIS — I10 Essential (primary) hypertension: Secondary | ICD-10-CM

## 2022-08-16 DIAGNOSIS — E6609 Other obesity due to excess calories: Secondary | ICD-10-CM

## 2022-08-16 DIAGNOSIS — R7303 Prediabetes: Secondary | ICD-10-CM

## 2022-08-17 MED ORDER — WEGOVY 0.5 MG/0.5ML ~~LOC~~ SOAJ: 0.5000 mg | SUBCUTANEOUS | 0 refills | Status: DC

## 2022-09-11 ENCOUNTER — Other Ambulatory Visit: Payer: Self-pay | Admitting: Primary Care

## 2022-09-11 DIAGNOSIS — R7303 Prediabetes: Secondary | ICD-10-CM

## 2022-09-11 DIAGNOSIS — E6609 Other obesity due to excess calories: Secondary | ICD-10-CM

## 2022-09-11 MED ORDER — SEMAGLUTIDE-WEIGHT MANAGEMENT 1 MG/0.5ML ~~LOC~~ SOAJ: 1.0000 mg | SUBCUTANEOUS | 0 refills | Status: DC

## 2022-09-11 NOTE — Telephone Encounter (Signed)
Switched to 1 mg dose. Patient aware.

## 2022-09-11 NOTE — Telephone Encounter (Signed)
From: Tamala Julian To: Office of Pleas Koch, NP Sent: 09/10/2022 5:06 PM EST Subject: Medication Renewal Request  Refills have been requested for the following medications:   Semaglutide-Weight Management (WEGOVY) 0.5 MG/0.5ML SOAJ [Jennfer Gassen K Alisea Matte]  Preferred pharmacy: [facility] [place], [place] AT [facility] OF [address] [facility] Delivery method: [employer]

## 2022-09-17 ENCOUNTER — Other Ambulatory Visit: Payer: Self-pay | Admitting: Primary Care

## 2022-09-17 DIAGNOSIS — F411 Generalized anxiety disorder: Secondary | ICD-10-CM

## 2022-10-08 ENCOUNTER — Other Ambulatory Visit: Payer: Self-pay | Admitting: Primary Care

## 2022-10-08 DIAGNOSIS — R7303 Prediabetes: Secondary | ICD-10-CM

## 2022-10-08 DIAGNOSIS — E6609 Other obesity due to excess calories: Secondary | ICD-10-CM

## 2022-10-08 MED ORDER — SEMAGLUTIDE-WEIGHT MANAGEMENT 1 MG/0.5ML ~~LOC~~ SOAJ: 1.0000 mg | SUBCUTANEOUS | 0 refills | Status: DC

## 2022-10-08 NOTE — Telephone Encounter (Signed)
From: Tamala Julian To: Office of Pleas Koch, NP Sent: 10/08/2022 3:31 PM EDT Subject: Medication Renewal Request  Refills have been requested for the following medications:   Semaglutide-Weight Management 1 MG/0.5ML SOAJ [Sharise Lippy K Laksh Hinners]  Patient Comment: I have appointment scheduled next week but I need a refill for the medication before I see Anda Kraft  Preferred pharmacy: [id number] - [address] [place], [place] Delivery method: [employer]

## 2022-10-19 ENCOUNTER — Encounter: Payer: Self-pay | Admitting: Primary Care

## 2022-10-19 ENCOUNTER — Ambulatory Visit (INDEPENDENT_AMBULATORY_CARE_PROVIDER_SITE_OTHER): Payer: Managed Care, Other (non HMO) | Admitting: Primary Care

## 2022-10-19 VITALS — BP 126/82 | HR 90 | Temp 97.3°F | Ht 67.0 in | Wt 211.0 lb

## 2022-10-19 DIAGNOSIS — E6609 Other obesity due to excess calories: Secondary | ICD-10-CM | POA: Diagnosis not present

## 2022-10-19 DIAGNOSIS — Z6833 Body mass index (BMI) 33.0-33.9, adult: Secondary | ICD-10-CM | POA: Diagnosis not present

## 2022-10-19 NOTE — Progress Notes (Signed)
Subjective:    Patient ID: Jessica Hampton, female    DOB: Aug 29, 1970, 52 y.o.   MRN: [id number]  HPI  Jessica Hampton is a very pleasant 52 y.o. female with a history of prediabetes, class 1 and 2 obesity, hypertension, abnormal uterine bleeding who presents today for follow up of weight loss treatment.  Previously managed on Mounjaro during the majority of 2023 for obesity, did very well and lost 30-35 pounds. Her insurance stopped covering [place] in late 2023 so she tried metformin and Topamax which were not beneficial for weight loss.  She is currently managed on Wegovy 1 mg weekly for the last three months. She denies nausea, constipation.  Around Christmas she was 222 pounds and today she weighed around 209 on her scale. She is interested in increasing her dose. She had some constipation in the beginning but this improved with the addition of chia seeds to her diet.   Diet currently consists of:  Breakfast: Eggs, cottage cheese Lunch: Chicken, protein shake, fruit, veggies and humus Dinner: Salmon, chicken, veggie, sweet potatoes  Snacks: Occasionally yogurt  Desserts: None Beverages: Water, coffee with Stevia  Exercise: Walking multiple days weekly    Wt Readings from Last 3 Encounters:  10/19/22 211 lb (95.7 kg)  06/29/22 211 lb (95.7 kg)  06/15/22 215 lb (97.5 kg)      Review of Systems  Respiratory:  Negative for shortness of breath.   Cardiovascular:  Negative for chest pain.  Gastrointestinal:  Negative for abdominal pain, constipation and nausea.         Past Medical History:  Diagnosis Date   Acute CVA (cerebrovascular accident) ([place]) 10/04/2019   Anemia    Anxiety    Complication of anesthesia    Family history of adverse reaction to anesthesia    MOM NAUSEATED   Fibroids, intramural 07/14/2020   GERD (gastroesophageal reflux disease)    OCC   Hypertension    PONV (postoperative nausea and vomiting)    NAUSEATED    Social History    Socioeconomic History   Marital status: Married    Spouse name: Not on file   Number of children: Not on file   Years of education: Not on file   Highest education level: Associate degree: academic program  Occupational History   Occupation: Soil scientist  Tobacco Use   Smoking status: Never   Smokeless tobacco: Never  Vaping Use   Vaping Use: Never used  Substance and Sexual Activity   Alcohol use: No   Drug use: No   Sexual activity: Not on file  Other Topics Concern   Not on file  Social History Narrative   Lives with husband   Social Determinants of Health   Financial Resource Strain: Low Risk  (10/18/2022)   Overall Financial Resource Strain (CARDIA)    Difficulty of Paying Living Expenses: Not hard at all  Food Insecurity: No Food Insecurity (10/18/2022)   Hunger Vital Sign    Worried About Running Out of Food in the Last Year: Never true    [place] in the Last Year: Never true  Transportation Needs: No Transportation Needs (10/18/2022)   PRAPARE - Hydrologist (Medical): No    Lack of Transportation (Non-Medical): No  Physical Activity: Sufficiently Active (10/18/2022)   Exercise Vital Sign    Days of Exercise per Week: 4 days    Minutes of Exercise per Session: 40 min  Stress: No Stress Concern  Present (10/18/2022)   [place]    Feeling of Stress : Not at all  Social Connections: [place] (10/18/2022)   Social Connection and Isolation Panel [NHANES]    Frequency of Communication with Friends and Family: More than three times a week    Frequency of Social Gatherings with Friends and Family: Once a week    Attends Religious Services: More than 4 times per year    Active Member of Clubs or Organizations: Yes    Attends Music therapist: More than 4 times per year    Marital Status: Married  Human resources officer Violence: Not on file     Past Surgical History:  Procedure Laterality Date   ABDOMINAL HYSTERECTOMY N/A 07/14/2020   Procedure: TOTAL HYSTERECTOMY ABDOMINAL;  Surgeon: Benjaman Kindler, MD;  Location: [facility];  Service: Gynecology;  Laterality: N/A;   APPLICATION OF WOUND VAC  07/14/2020   Procedure: APPLICATION OF WOUND VAC;  Surgeon: Benjaman Kindler, MD;  Location: [facility];  Service: Gynecology;;  serial# [id number]   COLONOSCOPY WITH PROPOFOL N/A 06/29/2022   Procedure: COLONOSCOPY WITH PROPOFOL;  Surgeon: Lin Landsman, MD;  Location: [facility];  Service: Gastroenterology;  Laterality: N/A;   CYSTOSCOPY N/A 07/14/2020   Procedure: CYSTOSCOPY;  Surgeon: Benjaman Kindler, MD;  Location: [facility];  Service: Gynecology;  Laterality: N/A;   LAPAROSCOPIC GASTRIC SLEEVE RESECTION WITH HIATAL HERNIA REPAIR N/A 10/02/2016   Procedure: LAPAROSCOPIC GASTRIC SLEEVE RESECTION WITH HIATAL HERNIA REPAIR;  Surgeon: Bonner Puna, MD;  Location: [facility];  Service: General;  Laterality: N/A;   LOOP RECORDER INSERTION N/A 11/19/2019   Procedure: LOOP RECORDER INSERTION;  Surgeon: Isaias Cowman, MD;  Location: [place] [facility];  Service: Cardiovascular;  Laterality: N/A;   TEE WITHOUT CARDIOVERSION N/A 11/24/2019   Procedure: TRANSESOPHAGEAL ECHOCARDIOGRAM (TEE);  Surgeon: Teodoro Spray, MD;  Location: [facility];  Service: Cardiovascular;  Laterality: N/A;   TOTAL LAPAROSCOPIC HYSTERECTOMY WITH SALPINGECTOMY Bilateral 07/14/2020   Procedure: TOTAL LAPAROSCOPIC HYSTERECTOMY WITH BILATERAL SALPINGECTOMY;  Surgeon: Benjaman Kindler, MD;  Location: [facility];  Service: Gynecology;  Laterality: Bilateral;   TUBAL LIGATION  2004    Family History  Problem Relation Age of Onset   Hypertension Mother    Hyperlipidemia Father    Hypertension Father     Allergies  Allergen Reactions   Lisinopril Shortness Of Breath and Cough    No airway swelling.   Nsaids Other (See Comments)    History of gastric bypass      Current Outpatient Medications on File Prior to Visit  Medication Sig Dispense Refill   buPROPion (WELLBUTRIN XL) 150 MG 24 hr tablet TAKE 1 TABLET(150 MG) BY MOUTH DAILY FOR ANXIETY OR DEPRESSION 90 tablet 2   FLUoxetine (PROZAC) 40 MG capsule TAKE 1 CAPSULE(40 MG) BY MOUTH DAILY FOR ANXIETY 90 capsule 2   losartan (COZAAR) 25 MG tablet TAKE 1 TABLET(25 MG) BY MOUTH DAILY FOR BLOOD PRESSURE 90 tablet 2   Multiple Vitamins-Minerals (BARIATRIC MULTIVITAMINS/IRON PO) Take 1 tablet by mouth daily with lunch.      rosuvastatin (CRESTOR) 5 MG tablet Take 1 tablet (5 mg total) by mouth daily. for cholesterol. 90 tablet 3   Semaglutide-Weight Management 1 MG/0.5ML SOAJ Inject 1 mg into the skin once a week. 2 mL 0   aspirin EC 325 MG tablet Take 81 mg by mouth daily with lunch.     Insulin Pen Needle (PEN NEEDLES) 31G X 6  MM MISC Use nightly with insulin. 100 each 0   No current facility-administered medications on file prior to visit.    BP 126/82   Pulse 90   Temp (!) 97.3 F (36.3 C) (Temporal)   Ht 5\' 7"  (1.702 m)   Wt 211 lb (95.7 kg)   LMP 07/04/2020 (Exact Date)   SpO2 99%   BMI 33.05 kg/m  Objective:   Physical Exam Cardiovascular:     Rate and Rhythm: Normal rate and regular rhythm.  Pulmonary:     Effort: Pulmonary effort is normal.     Breath sounds: Normal breath sounds.  Musculoskeletal:     Cervical back: Neck supple.  Skin:    General: Skin is warm and dry.           Assessment & Plan:  Class 1 obesity due to excess calories with serious comorbidity and body mass index (BMI) of 33.0 to 33.9 in adult Assessment & Plan: Commended her on her diet and physical activity!  Continue Wegovy 1 mg weekly for another 2 weeks, then increase to 1.7 mg weekly thereafter.  Follow up later this Summer.         Pleas Koch, NP

## 2022-10-19 NOTE — Assessment & Plan Note (Signed)
Commended her on her diet and physical activity!  Continue Wegovy 1 mg weekly for another 2 weeks, then increase to 1.7 mg weekly thereafter.  Follow up later this Summer.

## 2022-10-19 NOTE — Patient Instructions (Signed)
Keep up the great work with your diet and exercise!  Notify me when you use your last 1 mg dose pen.  See you this Summer!

## 2022-11-01 DIAGNOSIS — E6609 Other obesity due to excess calories: Secondary | ICD-10-CM

## 2022-11-01 MED ORDER — SEMAGLUTIDE-WEIGHT MANAGEMENT 1.7 MG/0.75ML ~~LOC~~ SOAJ: 1.7000 mg | SUBCUTANEOUS | 0 refills | Status: DC

## 2022-11-27 MED ORDER — SEMAGLUTIDE-WEIGHT MANAGEMENT 2.4 MG/0.75ML ~~LOC~~ SOAJ: 2.4000 mg | SUBCUTANEOUS | 0 refills | Status: DC

## 2023-01-18 ENCOUNTER — Encounter: Payer: Managed Care, Other (non HMO) | Admitting: Primary Care

## 2023-02-01 ENCOUNTER — Telehealth: Payer: Self-pay

## 2023-02-01 DIAGNOSIS — Z8673 Personal history of transient ischemic attack (TIA), and cerebral infarction without residual deficits: Secondary | ICD-10-CM

## 2023-02-01 MED ORDER — ROSUVASTATIN CALCIUM 5 MG PO TABS: 5.0000 mg | ORAL_TABLET | Freq: Every day | ORAL | 0 refills | Status: DC

## 2023-02-01 NOTE — Addendum Note (Signed)
Addended by: Kierstynn Babich K on: 02/01/2023 01:23 PM   Modules accepted: Orders  

## 2023-02-01 NOTE — Telephone Encounter (Signed)
Refills sent to pharmacy. 

## 2023-02-15 ENCOUNTER — Encounter: Payer: Managed Care, Other (non HMO) | Admitting: Primary Care

## 2023-02-17 ENCOUNTER — Other Ambulatory Visit: Payer: Self-pay | Admitting: Primary Care

## 2023-02-17 DIAGNOSIS — E6609 Other obesity due to excess calories: Secondary | ICD-10-CM

## 2023-03-08 ENCOUNTER — Encounter: Payer: Managed Care, Other (non HMO) | Admitting: Primary Care

## 2023-03-14 ENCOUNTER — Encounter (INDEPENDENT_AMBULATORY_CARE_PROVIDER_SITE_OTHER): Payer: Self-pay

## 2023-03-19 ENCOUNTER — Other Ambulatory Visit: Payer: Self-pay | Admitting: Primary Care

## 2023-03-19 ENCOUNTER — Telehealth: Payer: Self-pay | Admitting: Primary Care

## 2023-03-19 DIAGNOSIS — E6609 Other obesity due to excess calories: Secondary | ICD-10-CM

## 2023-03-19 MED ORDER — WEGOVY 2.4 MG/0.75ML ~~LOC~~ SOAJ: 2.4000 mg | SUBCUTANEOUS | 0 refills | Status: DC

## 2023-03-19 NOTE — Telephone Encounter (Signed)
Prescription Request  03/19/2023  LOV: 10/19/2022  What is the name of the medication or equipment? Semaglutide-Weight Management (WEGOVY) 2.4 MG/0.75ML SOAJ   Have you contacted your pharmacy to request a refill? Yes   Which pharmacy would you like this sent to?  [id number] [place], [place] - [address] AT [facility] OF [address] [place] [address] [place] [address] Phone: [phone] Fax: [phone]    Patient notified that their request is being sent to the clinical staff for review and that they should receive a response within 2 business days.   Please advise at Mobile [phone] (mobile)

## 2023-03-20 NOTE — Telephone Encounter (Signed)
Reviewed chart script called in yesterday. Called number on file but call can not be completed at this time. Will send my chart to let her know.

## 2023-03-21 ENCOUNTER — Telehealth: Payer: Self-pay

## 2023-03-21 ENCOUNTER — Other Ambulatory Visit (HOSPITAL_COMMUNITY): Payer: Self-pay

## 2023-03-21 NOTE — Telephone Encounter (Signed)
Pharmacy Patient Advocate Encounter   Received notification from Patient Advice Request messages that prior authorization for [facility] 2.4MG /0.75ML is required/requested.   Insurance verification completed.   The patient is insured through [facility] .   Per test claim: PA required; PA submitted to [facility] via CoverMyMeds Key/confirmation #/[id number] Status is pending

## 2023-03-22 ENCOUNTER — Encounter: Payer: Managed Care, Other (non HMO) | Admitting: Primary Care

## 2023-03-22 ENCOUNTER — Other Ambulatory Visit (HOSPITAL_COMMUNITY): Payer: Self-pay

## 2023-03-22 NOTE — Telephone Encounter (Signed)
My chart sent to patient to let know medication approved.

## 2023-03-22 NOTE — Telephone Encounter (Signed)
Pharmacy Patient Advocate Encounter  Received notification from [facility] that Prior Authorization for [facility] 2.4MG /0.75ML auto-injectors has been APPROVED from 03/21/23 to 03/20/24   PA #/Case ID/Reference #: [id number]

## 2023-04-05 ENCOUNTER — Encounter: Payer: Managed Care, Other (non HMO) | Admitting: Primary Care

## 2023-04-16 ENCOUNTER — Other Ambulatory Visit: Payer: Self-pay

## 2023-04-16 DIAGNOSIS — F411 Generalized anxiety disorder: Secondary | ICD-10-CM

## 2023-04-16 MED ORDER — FLUOXETINE HCL 40 MG PO CAPS: 40.0000 mg | ORAL_CAPSULE | Freq: Every day | ORAL | 0 refills | Status: DC

## 2023-04-17 ENCOUNTER — Other Ambulatory Visit: Payer: Self-pay | Admitting: Primary Care

## 2023-04-17 DIAGNOSIS — F411 Generalized anxiety disorder: Secondary | ICD-10-CM

## 2023-04-23 ENCOUNTER — Other Ambulatory Visit: Payer: Self-pay | Admitting: Primary Care

## 2023-04-23 DIAGNOSIS — E6609 Other obesity due to excess calories: Secondary | ICD-10-CM

## 2023-04-23 MED ORDER — WEGOVY 2.4 MG/0.75ML ~~LOC~~ SOAJ: 2.4000 mg | SUBCUTANEOUS | 0 refills | Status: DC

## 2023-05-03 ENCOUNTER — Encounter: Payer: Managed Care, Other (non HMO) | Admitting: Primary Care

## 2023-05-06 ENCOUNTER — Other Ambulatory Visit: Payer: Self-pay

## 2023-05-06 DIAGNOSIS — Z8673 Personal history of transient ischemic attack (TIA), and cerebral infarction without residual deficits: Secondary | ICD-10-CM

## 2023-05-06 MED ORDER — ROSUVASTATIN CALCIUM 5 MG PO TABS: 5.0000 mg | ORAL_TABLET | Freq: Every day | ORAL | 0 refills | Status: DC

## 2023-05-24 ENCOUNTER — Ambulatory Visit (INDEPENDENT_AMBULATORY_CARE_PROVIDER_SITE_OTHER): Payer: Managed Care, Other (non HMO) | Admitting: Primary Care

## 2023-05-24 ENCOUNTER — Encounter: Payer: Self-pay | Admitting: Primary Care

## 2023-05-24 VITALS — BP 140/82 | HR 94 | Temp 97.7°F | Ht 67.0 in | Wt 204.8 lb

## 2023-05-24 DIAGNOSIS — E66811 Obesity, class 1: Secondary | ICD-10-CM

## 2023-05-24 DIAGNOSIS — Z8673 Personal history of transient ischemic attack (TIA), and cerebral infarction without residual deficits: Secondary | ICD-10-CM | POA: Diagnosis not present

## 2023-05-24 DIAGNOSIS — E6609 Other obesity due to excess calories: Secondary | ICD-10-CM

## 2023-05-24 DIAGNOSIS — Z0000 Encounter for general adult medical examination without abnormal findings: Secondary | ICD-10-CM

## 2023-05-24 DIAGNOSIS — G479 Sleep disorder, unspecified: Secondary | ICD-10-CM | POA: Insufficient documentation

## 2023-05-24 DIAGNOSIS — R7303 Prediabetes: Secondary | ICD-10-CM

## 2023-05-24 DIAGNOSIS — Z6832 Body mass index (BMI) 32.0-32.9, adult: Secondary | ICD-10-CM

## 2023-05-24 DIAGNOSIS — F411 Generalized anxiety disorder: Secondary | ICD-10-CM

## 2023-05-24 DIAGNOSIS — I10 Essential (primary) hypertension: Secondary | ICD-10-CM | POA: Diagnosis not present

## 2023-05-24 DIAGNOSIS — Z1231 Encounter for screening mammogram for malignant neoplasm of breast: Secondary | ICD-10-CM

## 2023-05-24 LAB — LIPID PANEL
Cholesterol: 136 mg/dL (ref 0–200)
HDL: 72.8 mg/dL (ref >39.00)
LDL Cholesterol: 43 mg/dL (ref 0–99)
NonHDL: 62.92
Total CHOL/HDL Ratio: 2
Triglycerides: 98 mg/dL (ref 0.0–149.0)
VLDL: 19.6 mg/dL (ref 0.0–40.0)

## 2023-05-24 LAB — CBC
HCT: 42.1 % (ref 36.0–46.0)
Hemoglobin: 13.4 g/dL (ref 12.0–15.0)
MCHC: 31.8 g/dL (ref 30.0–36.0)
MCV: 90.1 fL (ref 78.0–100.0)
Platelets: 234 10*3/uL (ref 150.0–400.0)
RBC: 4.67 Mil/uL (ref 3.87–5.11)
RDW: 13.5 % (ref 11.5–15.5)
WBC: 6.8 10*3/uL (ref 4.0–10.5)

## 2023-05-24 LAB — COMPREHENSIVE METABOLIC PANEL
ALT: 14 U/L (ref 0–35)
AST: 16 U/L (ref 0–37)
Albumin: 4.3 g/dL (ref 3.5–5.2)
Alkaline Phosphatase: 53 U/L (ref 39–117)
BUN: 13 mg/dL (ref 6–23)
CO2: 30 meq/L (ref 19–32)
Calcium: 9.2 mg/dL (ref 8.4–10.5)
Chloride: 101 meq/L (ref 96–112)
Creatinine, Ser: 0.65 mg/dL (ref 0.40–1.20)
GFR: 102.19 mL/min (ref >60.00)
Glucose, Bld: 72 mg/dL (ref 70–99)
Potassium: 3.9 meq/L (ref 3.5–5.1)
Sodium: 139 meq/L (ref 135–145)
Total Bilirubin: 0.7 mg/dL (ref 0.2–1.2)
Total Protein: 6.5 g/dL (ref 6.0–8.3)

## 2023-05-24 LAB — HEMOGLOBIN A1C: Hgb A1c MFr Bld: 5.2 % (ref 4.6–6.5)

## 2023-05-24 NOTE — Assessment & Plan Note (Signed)
Improved, but still above goal.  I have asked that she start monitoring her blood pressure at home and report if readings are consistently at or above 135/90. Continue losartan 25 mg daily for now.  CMP pending.

## 2023-05-24 NOTE — Assessment & Plan Note (Signed)
Overall controlled.  Continue bupropion XL 150 mg daily, fluoxetine 40 mg daily.

## 2023-05-24 NOTE — Assessment & Plan Note (Signed)
Declines influenza and Shingrix vaccines. Pap smear UTD.  Follows with GYN Mammogram due, orders placed. Colonoscopy UTD, due 2033  Discussed the importance of a healthy diet and regular exercise in order for weight loss, and to reduce the risk of further co-morbidity.  Exam stable. Labs pending.  Follow up in 1 year for repeat physical.

## 2023-05-24 NOTE — Assessment & Plan Note (Signed)
Commended her on weight loss! Repeat A1c pending.

## 2023-05-24 NOTE — Assessment & Plan Note (Signed)
Discussed healthy bedtime habits and hygiene.  Recommended she avoid screen time 1 hour prior to bedtime. Continue regular exercise and limiting caffeine.  She will update if no improvement.

## 2023-05-24 NOTE — Assessment & Plan Note (Signed)
No new symptoms. Continue rosuvastatin 5 mg daily.  Lipid panel pending.

## 2023-05-24 NOTE — Assessment & Plan Note (Signed)
Commended her on weight loss! Encouraged continued regular exercise and a healthy diet.  Continue Wegovy 2.4 mg weekly. Discussed goal weight which is 175 per patient request.  Discussed future plan for maintenance treatment with [facility].

## 2023-05-24 NOTE — Progress Notes (Signed)
Subjective:    Patient ID: Jessica Hampton, female    DOB: 1970/11/16, 52 y.o.   MRN: [id number]  HPI  Jessica Hampton is a very pleasant 52 y.o. female who presents today for complete physical and follow up of chronic conditions.  She would also like to discuss sleep disturbance. Difficulty staying asleep, falls asleep well. She goes to be around 10:30 pm, wakes up at 3-4 am, sometimes will fall back asleep in an hour, sometimes stays awake until she has to get out of bed at 6:45 am. She drinks coffee in the morning, water throughout the day. Typical bedtime routine includes watching TV before bed, also plays on her phone. She is exercising 3-4 times weekly.   Immunizations: -Tetanus: Completed in 2023 -Influenza: Declines today -Shingles: Never completed, declines today  Diet: Fair diet.  Exercise: 3 times weekly   Eye exam: Completes annually  Dental exam: Completes semi-annually    Pap Smear: November 2021, follows with GYN Mammogram: never completed  Colonoscopy: Completed in 2023, due 2033  BP Readings from Last 3 Encounters:  05/24/23 (!) 140/82  10/19/22 126/82  06/29/22 (!) 105/59   Wt Readings from Last 3 Encounters:  05/24/23 204 lb 12.8 oz (92.9 kg)  10/19/22 211 lb (95.7 kg)  06/29/22 211 lb (95.7 kg)         Review of Systems  Constitutional:  Negative for unexpected weight change.  HENT:  Negative for rhinorrhea.   Respiratory:  Negative for cough and shortness of breath.   Cardiovascular:  Negative for chest pain.  Gastrointestinal:  Negative for constipation and diarrhea.  Genitourinary:  Negative for difficulty urinating and menstrual problem.  Musculoskeletal:  Negative for arthralgias and myalgias.  Skin:  Negative for rash.  Allergic/Immunologic: Negative for environmental allergies.  Neurological:  Negative for dizziness, numbness and headaches.  Psychiatric/Behavioral:  The patient is not nervous/anxious.          Past Medical  History:  Diagnosis Date   Abnormal uterine bleeding 04/29/2020   Acute CVA (cerebrovascular accident) (HCC) 10/04/2019   Anemia    Anxiety    Complication of anesthesia    Family history of adverse reaction to anesthesia    MOM NAUSEATED   Fibroids, intramural 07/14/2020   GERD (gastroesophageal reflux disease)    OCC   Hypertension    Hypertensive urgency 10/04/2019   PONV (postoperative nausea and vomiting)    NAUSEATED    Social History   Socioeconomic History   Marital status: Married    Spouse name: Not on file   Number of children: Not on file   Years of education: Not on file   Highest education level: Associate degree: academic program  Occupational History   Occupation: Theme park manager  Tobacco Use   Smoking status: Never   Smokeless tobacco: Never  Vaping Use   Vaping status: Never Used  Substance and Sexual Activity   Alcohol use: No   Drug use: No   Sexual activity: Not on file  Other Topics Concern   Not on file  Social History Narrative   Lives with husband   Social Determinants of Health   Financial Resource Strain: Low Risk  (05/20/2023)   Overall Financial Resource Strain (CARDIA)    Difficulty of Paying Living Expenses: Not hard at all  Food Insecurity: No Food Insecurity (05/20/2023)   Hunger Vital Sign    Worried About Running Out of Food in the Last Year: Never true    Ran  Out of Food in the Last Year: Never true  Transportation Needs: No Transportation Needs (05/20/2023)   PRAPARE - Administrator, Civil Service (Medical): No    Lack of Transportation (Non-Medical): No  Physical Activity: Sufficiently Active (05/20/2023)   Exercise Vital Sign    Days of Exercise per Week: 4 days    Minutes of Exercise per Session: 40 min  Stress: No Stress Concern Present (05/20/2023)   [employer] of Occupational Health - Occupational Stress Questionnaire    Feeling of Stress : Only a little  Social Connections: Socially Integrated  (05/20/2023)   Social Connection and Isolation Panel [NHANES]    Frequency of Communication with Friends and Family: More than three times a week    Frequency of Social Gatherings with Friends and Family: Once a week    Attends Religious Services: More than 4 times per year    Active Member of Clubs or Organizations: Yes    Attends Banker Meetings: More than 4 times per year    Marital Status: Married  Catering manager Violence: Not on file    Past Surgical History:  Procedure Laterality Date   ABDOMINAL HYSTERECTOMY N/A 07/14/2020   Procedure: TOTAL HYSTERECTOMY ABDOMINAL;  Surgeon: Christeen Douglas, MD;  Location: [facility] ORS;  Service: Gynecology;  Laterality: N/A;   APPLICATION OF WOUND VAC  07/14/2020   Procedure: APPLICATION OF WOUND VAC;  Surgeon: Christeen Douglas, MD;  Location: [facility];  Service: Gynecology;;  serial# [id number]   COLONOSCOPY WITH PROPOFOL N/A 06/29/2022   Procedure: COLONOSCOPY WITH PROPOFOL;  Surgeon: Toney Reil, MD;  Location: [facility];  Service: Gastroenterology;  Laterality: N/A;   CYSTOSCOPY N/A 07/14/2020   Procedure: CYSTOSCOPY;  Surgeon: Christeen Douglas, MD;  Location: [facility];  Service: Gynecology;  Laterality: N/A;   LAPAROSCOPIC GASTRIC SLEEVE RESECTION WITH HIATAL HERNIA REPAIR N/A 10/02/2016   Procedure: LAPAROSCOPIC GASTRIC SLEEVE RESECTION WITH HIATAL HERNIA REPAIR;  Surgeon: Geoffry Paradise, MD;  Location: [facility];  Service: General;  Laterality: N/A;   LOOP RECORDER INSERTION N/A 11/19/2019   Procedure: LOOP RECORDER INSERTION;  Surgeon: Marcina Millard, MD;  Location: [facility] INVASIVE CV LAB;  Service: Cardiovascular;  Laterality: N/A;   TEE WITHOUT CARDIOVERSION N/A 11/24/2019   Procedure: TRANSESOPHAGEAL ECHOCARDIOGRAM (TEE);  Surgeon: Dalia Heading, MD;  Location: [facility];  Service: Cardiovascular;  Laterality: N/A;   TOTAL LAPAROSCOPIC HYSTERECTOMY WITH SALPINGECTOMY Bilateral 07/14/2020   Procedure: TOTAL  LAPAROSCOPIC HYSTERECTOMY WITH BILATERAL SALPINGECTOMY;  Surgeon: Christeen Douglas, MD;  Location: [facility];  Service: Gynecology;  Laterality: Bilateral;   TUBAL LIGATION  2004    Family History  Problem Relation Age of Onset   Hypertension Mother    Hyperlipidemia Father    Hypertension Father     Allergies  Allergen Reactions   Lisinopril Shortness Of Breath and Cough    No airway swelling.   Nsaids Other (See Comments)    History of gastric bypass     Current Outpatient Medications on File Prior to Visit  Medication Sig Dispense Refill   buPROPion (WELLBUTRIN XL) 150 MG 24 hr tablet TAKE 1 TABLET(150 MG) BY MOUTH DAILY FOR ANXIETY OR DEPRESSION 90 tablet 2   FLUoxetine (PROZAC) 40 MG capsule Take 1 capsule (40 mg total) by mouth daily. for anxiety 90 capsule 0   losartan (COZAAR) 25 MG tablet TAKE 1 TABLET(25 MG) BY MOUTH DAILY FOR BLOOD PRESSURE 90 tablet 2   Multiple Vitamins-Minerals (BARIATRIC MULTIVITAMINS/IRON PO) Take 1 tablet  by mouth daily with lunch.      rosuvastatin (CRESTOR) 5 MG tablet Take 1 tablet (5 mg total) by mouth daily. for cholesterol. 30 tablet 0   Semaglutide-Weight Management (WEGOVY) 2.4 MG/0.75ML SOAJ Inject 2.4 mg into the skin once a week. 3 mL 0   No current facility-administered medications on file prior to visit.    BP (!) 140/82 (BP Location: Left Arm, Patient Position: Sitting, Cuff Size: Normal)   Pulse 94   Temp 97.7 F (36.5 C) (Temporal)   Ht 5\' 7"  (1.702 m)   Wt 204 lb 12.8 oz (92.9 kg)   LMP 07/04/2020 (Exact Date)   SpO2 97%   BMI 32.08 kg/m  Objective:   Physical Exam HENT:     Right Ear: Tympanic membrane and ear canal normal.     Left Ear: Tympanic membrane and ear canal normal.  Eyes:     Pupils: Pupils are equal, round, and reactive to light.  Cardiovascular:     Rate and Rhythm: Normal rate and regular rhythm.  Pulmonary:     Effort: Pulmonary effort is normal.     Breath sounds: Normal breath sounds.  Abdominal:      General: Bowel sounds are normal.     Palpations: Abdomen is soft.     Tenderness: There is no abdominal tenderness.  Musculoskeletal:        General: Normal range of motion.     Cervical back: Neck supple.  Skin:    General: Skin is warm and dry.  Neurological:     Mental Status: She is alert and oriented to person, place, and time.     Cranial Nerves: No cranial nerve deficit.     Deep Tendon Reflexes:     Reflex Scores:      Patellar reflexes are 2+ on the right side and 2+ on the left side. Psychiatric:        Mood and Affect: Mood normal.           Assessment & Plan:  Preventative health care Assessment & Plan: Declines influenza and Shingrix vaccines. Pap smear UTD.  Follows with GYN Mammogram due, orders placed. Colonoscopy UTD, due 2033  Discussed the importance of a healthy diet and regular exercise in order for weight loss, and to reduce the risk of further co-morbidity.  Exam stable. Labs pending.  Follow up in 1 year for repeat physical.    Screening mammogram for breast cancer -     3D Screening Mammogram, Left and Right; Future  Class 1 obesity due to excess calories without serious comorbidity with body mass index (BMI) of 32.0 to 32.9 in adult Assessment & Plan: Commended her on weight loss! Encouraged continued regular exercise and a healthy diet.  Continue Wegovy 2.4 mg weekly. Discussed goal weight which is 175 per patient request.  Discussed future plan for maintenance treatment with [facility].   GAD (generalized anxiety disorder) Assessment & Plan: Overall controlled.  Continue bupropion XL 150 mg daily, fluoxetine 40 mg daily.   HYPERTENSION, BENIGN ESSENTIAL Assessment & Plan: Improved, but still above goal.  I have asked that she start monitoring her blood pressure at home and report if readings are consistently at or above 135/90. Continue losartan 25 mg daily for now.  CMP pending.  Orders: -     Lipid panel -      Comprehensive metabolic panel -     CBC  History of CVA (cerebrovascular accident) Assessment & Plan: No new symptoms. Continue  rosuvastatin 5 mg daily.  Lipid panel pending.  Orders: -     Lipid panel  Prediabetes Assessment & Plan: Commended her on weight loss!  Repeat A1c pending.  Orders: -     Hemoglobin A1c  Sleep disturbance Assessment & Plan: Discussed healthy bedtime habits and hygiene.  Recommended she avoid screen time 1 hour prior to bedtime. Continue regular exercise and limiting caffeine.  She will update if no improvement.         Doreene Nest, NP

## 2023-05-24 NOTE — Patient Instructions (Signed)
Stop by the lab prior to leaving today. I will notify you of your results once received.   [facility] to schedule your mammogram.   It was a pleasure to see you today!   

## 2023-05-28 ENCOUNTER — Other Ambulatory Visit: Payer: Self-pay | Admitting: Primary Care

## 2023-05-28 DIAGNOSIS — E6609 Other obesity due to excess calories: Secondary | ICD-10-CM

## 2023-05-28 MED ORDER — WEGOVY 2.4 MG/0.75ML ~~LOC~~ SOAJ: 2.4000 mg | SUBCUTANEOUS | 0 refills | Status: DC

## 2023-06-03 ENCOUNTER — Other Ambulatory Visit: Payer: Self-pay

## 2023-06-03 DIAGNOSIS — Z8673 Personal history of transient ischemic attack (TIA), and cerebral infarction without residual deficits: Secondary | ICD-10-CM

## 2023-06-03 MED ORDER — ROSUVASTATIN CALCIUM 5 MG PO TABS: 5.0000 mg | ORAL_TABLET | Freq: Every day | ORAL | 3 refills | Status: DC

## 2023-07-02 ENCOUNTER — Other Ambulatory Visit: Payer: Self-pay

## 2023-07-02 DIAGNOSIS — F411 Generalized anxiety disorder: Secondary | ICD-10-CM

## 2023-07-03 MED ORDER — BUPROPION HCL ER (XL) 150 MG PO TB24: 150.0000 mg | ORAL_TABLET | Freq: Every day | ORAL | 2 refills | Status: DC

## 2023-07-25 ENCOUNTER — Other Ambulatory Visit: Payer: Self-pay | Admitting: Primary Care

## 2023-07-25 DIAGNOSIS — F411 Generalized anxiety disorder: Secondary | ICD-10-CM

## 2023-07-25 DIAGNOSIS — I10 Essential (primary) hypertension: Secondary | ICD-10-CM

## 2023-07-26 ENCOUNTER — Other Ambulatory Visit: Payer: Self-pay | Admitting: Primary Care

## 2023-07-26 DIAGNOSIS — E66811 Obesity, class 1: Secondary | ICD-10-CM

## 2023-07-27 ENCOUNTER — Telehealth: Payer: Managed Care, Other (non HMO)

## 2023-07-27 DIAGNOSIS — J019 Acute sinusitis, unspecified: Secondary | ICD-10-CM

## 2023-07-27 DIAGNOSIS — B9689 Other specified bacterial agents as the cause of diseases classified elsewhere: Secondary | ICD-10-CM

## 2023-07-27 MED ORDER — AMOXICILLIN-POT CLAVULANATE 875-125 MG PO TABS: 1.0000 | ORAL_TABLET | Freq: Two times a day (BID) | ORAL | 0 refills | Status: AC

## 2023-07-27 MED ORDER — AZELASTINE-FLUTICASONE 137-50 MCG/ACT NA SUSP: 1.0000 | Freq: Two times a day (BID) | NASAL | 0 refills | Status: DC

## 2023-07-27 NOTE — Progress Notes (Signed)
Virtual Visit Consent   Jessica Hampton, you are scheduled for a virtual visit with a [facility] provider today. Just as with appointments in the office, your consent must be obtained to participate. Your consent will be active for this visit and any virtual visit you may have with one of our providers in the next 365 days. If you have a MyChart account, a copy of this consent can be sent to you electronically.  As this is a virtual visit, video technology does not allow for your provider to perform a traditional examination. This may limit your provider's ability to fully assess your condition. If your provider identifies any concerns that need to be evaluated in person or the need to arrange testing (such as labs, EKG, etc.), we will make arrangements to do so. Although advances in technology are sophisticated, we cannot ensure that it will always work on either your end or our end. If the connection with a video visit is poor, the visit may have to be switched to a telephone visit. With either a video or telephone visit, we are not always able to ensure that we have a secure connection.  By engaging in this virtual visit, you consent to the provision of healthcare and authorize for your insurance to be billed (if applicable) for the services provided during this visit. Depending on your insurance coverage, you may receive a charge related to this service.  I need to obtain your verbal consent now. Are you willing to proceed with your visit today? Jessica Hampton has provided verbal consent on 07/27/2023 for a virtual visit (video or telephone). Claiborne Rigg, NP  Date: 07/27/2023 8:45 AM  Virtual Visit via Video Note   I, Claiborne Rigg, connected with  Jessica Hampton  ([id number], 11/21/1970) on 07/27/23 at  8:45 AM EST by a video-enabled telemedicine application and verified that I am speaking with the correct person using two identifiers.  Location: Patient: Virtual Visit Location  Patient: Home Provider: Virtual Visit Location Provider: Home Office   I discussed the limitations of evaluation and management by telemedicine and the availability of in person appointments. The patient expressed understanding and agreed to proceed.    History of Present Illness: Jessica Hampton is a 52 y.o. who identifies as a female who was assigned female at birth, and is being seen today for symptoms of sinusitis.   Jessica Hampton states for the past 8 days she has been experiencing sore throat, runny nose, congestion in chest and sinuses, sinus headache, fatigue and productive cough with yellow sputum.  States everyone in her home is sick as well.    Problems:  Patient Active Problem List   Diagnosis Date Noted   Preventative health care 05/24/2023   Sleep disturbance 05/24/2023   Encounter for screening colonoscopy 06/29/2022   Class 1 obesity due to excess calories with body mass index (BMI) of 32.0 to 32.9 in adult 05/26/2021   Prediabetes 04/21/2021   GAD (generalized anxiety disorder) 10/08/2019   History of CVA (cerebrovascular accident) 10/04/2019   History of gastric surgery 10/02/2016   HYPERTENSION, BENIGN ESSENTIAL 04/15/2007   POLYCYSTIC OVARIAN DISEASE 04/14/2007    Allergies:  Allergies  Allergen Reactions   Lisinopril Shortness Of Breath and Cough    No airway swelling.   Nsaids Other (See Comments)    History of gastric bypass    Medications:  Current Outpatient Medications:    amoxicillin-clavulanate (AUGMENTIN) 875-125 MG tablet, Take 1 tablet by  mouth 2 (two) times daily for 7 days., Disp: 14 tablet, Rfl: 0   Azelastine-Fluticasone 137-50 MCG/ACT SUSP, Place 1 spray into the nose every 12 (twelve) hours., Disp: 23 g, Rfl: 0   buPROPion (WELLBUTRIN XL) 150 MG 24 hr tablet, Take 1 tablet (150 mg total) by mouth daily. For anxiety, Disp: 90 tablet, Rfl: 2   FLUoxetine (PROZAC) 40 MG capsule, TAKE 1 CAPSULE(40 MG) BY MOUTH DAILY FOR ANXIETY, Disp: 90 capsule,  Rfl: 2   losartan (COZAAR) 25 MG tablet, TAKE 1 TABLET(25 MG) BY MOUTH DAILY FOR BLOOD PRESSURE, Disp: 90 tablet, Rfl: 2   Multiple Vitamins-Minerals (BARIATRIC MULTIVITAMINS/IRON PO), Take 1 tablet by mouth daily with lunch. , Disp: , Rfl:    rosuvastatin (CRESTOR) 5 MG tablet, Take 1 tablet (5 mg total) by mouth daily. for cholesterol., Disp: 90 tablet, Rfl: 3   Semaglutide-Weight Management (WEGOVY) 2.4 MG/0.75ML SOAJ, Inject 2.4 mg into the skin once a week., Disp: 9 mL, Rfl: 0  Observations/Objective: Patient is well-developed, well-nourished in no acute distress.  Resting comfortably at home.  Head is normocephalic, atraumatic.  No labored breathing.  Speech is clear and coherent with logical content.  Patient is alert and oriented at baseline.    Assessment and Plan: 1. Acute bacterial sinusitis (Primary) - amoxicillin-clavulanate (AUGMENTIN) 875-125 MG tablet; Take 1 tablet by mouth 2 (two) times daily for 7 days.  Dispense: 14 tablet; Refill: 0 - Azelastine-Fluticasone 137-50 MCG/ACT SUSP; Place 1 spray into the nose every 12 (twelve) hours.  Dispense: 23 g; Refill: 0  INSTRUCTIONS: use a humidifier for nasal congestion Drink plenty of fluids, rest and wash hands frequently to avoid the spread of infection Alternate tylenol and Motrin for relief of fever   Follow Up Instructions: I discussed the assessment and treatment plan with the patient. The patient was provided an opportunity to ask questions and all were answered. The patient agreed with the plan and demonstrated an understanding of the instructions.  A copy of instructions were sent to the patient via MyChart unless otherwise noted below.    The patient was advised to call back or seek an in-person evaluation if the symptoms worsen or if the condition fails to improve as anticipated.    Claiborne Rigg, NP

## 2023-07-27 NOTE — Patient Instructions (Signed)
Jessica Hampton, thank you for joining Claiborne Rigg, NP for today's virtual visit.  While this provider is not your primary care provider (PCP), if your PCP is located in our provider database this encounter information will be shared with them immediately following your visit.   A Agency MyChart account gives you access to today's visit and all your visits, tests, and labs performed at [facility] " click here if you don't have a [facility] MyChart account or go to [email or web address]  Consent: (Patient) Jessica Hampton provided verbal consent for this virtual visit at the beginning of the encounter.  Current Medications:  Current Outpatient Medications:    amoxicillin-clavulanate (AUGMENTIN) 875-125 MG tablet, Take 1 tablet by mouth 2 (two) times daily for 7 days., Disp: 14 tablet, Rfl: 0   Azelastine-Fluticasone 137-50 MCG/ACT SUSP, Place 1 spray into the nose every 12 (twelve) hours., Disp: 23 g, Rfl: 0   buPROPion (WELLBUTRIN XL) 150 MG 24 hr tablet, Take 1 tablet (150 mg total) by mouth daily. For anxiety, Disp: 90 tablet, Rfl: 2   FLUoxetine (PROZAC) 40 MG capsule, TAKE 1 CAPSULE(40 MG) BY MOUTH DAILY FOR ANXIETY, Disp: 90 capsule, Rfl: 2   losartan (COZAAR) 25 MG tablet, TAKE 1 TABLET(25 MG) BY MOUTH DAILY FOR BLOOD PRESSURE, Disp: 90 tablet, Rfl: 2   Multiple Vitamins-Minerals (BARIATRIC MULTIVITAMINS/IRON PO), Take 1 tablet by mouth daily with lunch. , Disp: , Rfl:    rosuvastatin (CRESTOR) 5 MG tablet, Take 1 tablet (5 mg total) by mouth daily. for cholesterol., Disp: 90 tablet, Rfl: 3   Semaglutide-Weight Management (WEGOVY) 2.4 MG/0.75ML SOAJ, Inject 2.4 mg into the skin once a week., Disp: 9 mL, Rfl: 0   Medications ordered in this encounter:  Meds ordered this encounter  Medications   amoxicillin-clavulanate (AUGMENTIN) 875-125 MG tablet    Sig: Take 1 tablet by mouth 2 (two) times daily for 7 days.    Dispense:  14 tablet    Refill:  0     Supervising Provider:   Merrilee Jansky [[id number]]   Azelastine-Fluticasone 137-50 MCG/ACT SUSP    Sig: Place 1 spray into the nose every 12 (twelve) hours.    Dispense:  23 g    Refill:  0    Supervising Provider:   Merrilee Jansky [[id number]]     *If you need refills on other medications prior to your next appointment, please contact your pharmacy*  Follow-Up: Call back or seek an in-person evaluation if the symptoms worsen or if the condition fails to improve as anticipated.  [facility] [phone]  Other Instructions INSTRUCTIONS: use a humidifier for nasal congestion Drink plenty of fluids, rest and wash hands frequently to avoid the spread of infection Alternate tylenol and Motrin for relief of fever    If you have been instructed to have an in-person evaluation today at a local Urgent Care facility, please use the link below. It will take you to a list of all of our available [facility], including address, phone number and hours of operation. Please do not delay care.  [facility]  If you or a family member do not have a primary care provider, use the link below to schedule a visit and establish care. When you choose a [place] [facility] primary care physician or advanced practice provider, you gain a long-term partner in health. Find a Primary Care Provider  Learn more about 's in-office and virtual care  options: [facility] - Get Care Now

## 2023-08-09 ENCOUNTER — Ambulatory Visit
Admission: RE | Admit: 2023-08-09 | Discharge: 2023-08-09 | Disposition: A | Discharge status: Home / Self Care | Payer: Managed Care, Other (non HMO) | Source: Ambulatory Visit | Attending: Primary Care

## 2023-08-09 DIAGNOSIS — Z1231 Encounter for screening mammogram for malignant neoplasm of breast: Secondary | ICD-10-CM | POA: Insufficient documentation

## 2023-08-21 ENCOUNTER — Other Ambulatory Visit: Payer: Self-pay | Admitting: Primary Care

## 2023-08-21 DIAGNOSIS — E6609 Other obesity due to excess calories: Secondary | ICD-10-CM

## 2023-09-03 ENCOUNTER — Telehealth: Payer: Self-pay

## 2023-09-03 ENCOUNTER — Other Ambulatory Visit (HOSPITAL_COMMUNITY): Payer: Self-pay

## 2023-09-03 NOTE — Telephone Encounter (Signed)
*  Primary  Pharmacy Patient Advocate Encounter   Received notification from Patient Advice Request messages that prior authorization for Wegovy  2.4MG /0.75ML auto-injectors  is required/requested.   Insurance verification completed.   The patient is insured through [employer] .   Per test claim: PA required; PA submitted to above mentioned insurance via CoverMyMeds Key/confirmation #/EOC [id number] Status is pending

## 2023-09-03 NOTE — Telephone Encounter (Signed)
PA request has been Submitted. New Encounter created for follow up. For additional info see Pharmacy Prior Auth telephone encounter from 02/04.

## 2023-10-01 NOTE — Telephone Encounter (Signed)
 Pharmacy Patient Advocate Encounter  Received notification from [employer] that Prior Authorization for Wegovy 2.4mg   has been APPROVED from 09/03/2023 to 04/05/2024

## 2023-11-22 ENCOUNTER — Ambulatory Visit: Payer: Managed Care, Other (non HMO) | Admitting: Primary Care

## 2023-11-24 ENCOUNTER — Other Ambulatory Visit: Payer: Self-pay | Admitting: Primary Care

## 2023-11-24 DIAGNOSIS — E66811 Obesity, class 1: Secondary | ICD-10-CM

## 2023-12-20 ENCOUNTER — Ambulatory Visit: Admitting: Primary Care

## 2023-12-27 ENCOUNTER — Ambulatory Visit: Admitting: Primary Care

## 2024-01-10 ENCOUNTER — Ambulatory Visit: Admitting: Primary Care

## 2024-01-24 ENCOUNTER — Ambulatory Visit: Admitting: Primary Care

## 2024-02-14 ENCOUNTER — Ambulatory Visit: Admitting: Primary Care

## 2024-02-21 ENCOUNTER — Ambulatory Visit: Admitting: Primary Care

## 2024-02-21 DIAGNOSIS — E6609 Other obesity due to excess calories: Secondary | ICD-10-CM

## 2024-02-21 MED ORDER — WEGOVY 2.4 MG/0.75ML ~~LOC~~ SOAJ: 2.4000 mg | SUBCUTANEOUS | 0 refills | Status: DC

## 2024-03-06 ENCOUNTER — Ambulatory Visit: Admitting: Primary Care

## 2024-03-13 ENCOUNTER — Ambulatory Visit: Admitting: Primary Care

## 2024-03-22 ENCOUNTER — Other Ambulatory Visit: Payer: Self-pay | Admitting: Primary Care

## 2024-03-22 DIAGNOSIS — E66811 Obesity, class 1: Secondary | ICD-10-CM

## 2024-03-22 DIAGNOSIS — F411 Generalized anxiety disorder: Secondary | ICD-10-CM

## 2024-04-10 ENCOUNTER — Ambulatory Visit: Admitting: Primary Care

## 2024-04-17 ENCOUNTER — Ambulatory Visit: Admitting: Primary Care

## 2024-04-24 ENCOUNTER — Other Ambulatory Visit: Payer: Self-pay

## 2024-04-24 DIAGNOSIS — E66811 Obesity, class 1: Secondary | ICD-10-CM

## 2024-04-24 DIAGNOSIS — I10 Essential (primary) hypertension: Secondary | ICD-10-CM

## 2024-04-24 DIAGNOSIS — F411 Generalized anxiety disorder: Secondary | ICD-10-CM

## 2024-04-24 MED ORDER — LOSARTAN POTASSIUM 25 MG PO TABS: 25.0000 mg | ORAL_TABLET | Freq: Every day | ORAL | 0 refills | Status: DC

## 2024-04-24 MED ORDER — FLUOXETINE HCL 40 MG PO CAPS: 40.0000 mg | ORAL_CAPSULE | Freq: Every day | ORAL | 0 refills | Status: DC

## 2024-04-24 NOTE — Telephone Encounter (Signed)
 Patient's last CPE was 05/23/24 and she is scheduled for follow up on 05/15/24. Can we move her after 10/25 so we can do CPE?

## 2024-04-24 NOTE — Telephone Encounter (Signed)
 LVM to schedule CPE with same day labs after 10/25

## 2024-04-27 MED ORDER — WEGOVY 2.4 MG/0.75ML ~~LOC~~ SOAJ
2.4000 mg | SUBCUTANEOUS | 0 refills | Status: DC
Start: 2024-04-27 — End: 2024-05-28

## 2024-04-27 NOTE — Addendum Note (Signed)
 Addended by: Oluwaseyi Tull K on: 04/27/2024 06:31 PM   Modules accepted: Orders

## 2024-04-27 NOTE — Addendum Note (Signed)
 Addended by: NARCISO ANDREZ BROCKS on: 04/27/2024 01:12 PM   Modules accepted: Orders

## 2024-04-28 ENCOUNTER — Other Ambulatory Visit (HOSPITAL_COMMUNITY): Payer: Self-pay

## 2024-04-28 ENCOUNTER — Telehealth: Payer: Self-pay

## 2024-04-28 DIAGNOSIS — Z8673 Personal history of transient ischemic attack (TIA), and cerebral infarction without residual deficits: Secondary | ICD-10-CM

## 2024-04-28 DIAGNOSIS — E6609 Other obesity due to excess calories: Secondary | ICD-10-CM

## 2024-04-28 NOTE — Telephone Encounter (Signed)
 Pharmacy Patient Advocate Encounter   Received notification from Onbase that prior authorization for Wegovy  2.4 is required/requested.   Insurance verification completed.   The patient is insured through [employer] .   Per test claim: PA required; PA submitted to above mentioned insurance via Latent Key/confirmation #/EOC [id number] Status is pending

## 2024-05-01 ENCOUNTER — Other Ambulatory Visit (HOSPITAL_COMMUNITY): Payer: Self-pay

## 2024-05-01 NOTE — Telephone Encounter (Signed)
 Pharmacy Patient Advocate Encounter  Received notification from [employer] that Prior Authorization for Wegovy  2.4 has been APPROVED from 04/28/24 to 05/01/25. Ran test claim, Copay is $271.41. This test claim was processed through [facility]- copay amounts may vary at other pharmacies due to pharmacy/plan contracts, or as the patient moves through the different stages of their insurance plan.   PA #/Case ID/Reference #: [id number]

## 2024-05-15 ENCOUNTER — Ambulatory Visit: Admitting: Primary Care

## 2024-05-28 MED ORDER — WEGOVY 2.4 MG/0.75ML ~~LOC~~ SOAJ: 2.4000 mg | SUBCUTANEOUS | 0 refills | Status: DC

## 2024-05-28 MED ORDER — ROSUVASTATIN CALCIUM 5 MG PO TABS: 5.0000 mg | ORAL_TABLET | Freq: Every day | ORAL | 0 refills | Status: DC

## 2024-05-28 NOTE — Addendum Note (Signed)
 Addended by: Lorin Gawron K on: 05/28/2024 08:37 AM   Modules accepted: Orders

## 2024-06-05 ENCOUNTER — Ambulatory Visit: Admitting: Primary Care

## 2024-06-28 ENCOUNTER — Other Ambulatory Visit: Payer: Self-pay | Admitting: Primary Care

## 2024-06-28 DIAGNOSIS — F411 Generalized anxiety disorder: Secondary | ICD-10-CM

## 2024-06-29 ENCOUNTER — Other Ambulatory Visit: Payer: Self-pay | Admitting: Primary Care

## 2024-06-29 DIAGNOSIS — E6609 Other obesity due to excess calories: Secondary | ICD-10-CM

## 2024-07-03 ENCOUNTER — Ambulatory Visit: Admitting: Primary Care

## 2024-07-28 ENCOUNTER — Other Ambulatory Visit: Payer: Self-pay | Admitting: Primary Care

## 2024-07-28 DIAGNOSIS — Z8673 Personal history of transient ischemic attack (TIA), and cerebral infarction without residual deficits: Secondary | ICD-10-CM

## 2024-07-28 DIAGNOSIS — F411 Generalized anxiety disorder: Secondary | ICD-10-CM

## 2024-07-28 DIAGNOSIS — E6609 Other obesity due to excess calories: Secondary | ICD-10-CM

## 2024-07-28 DIAGNOSIS — I10 Essential (primary) hypertension: Secondary | ICD-10-CM

## 2024-07-28 NOTE — Telephone Encounter (Unsigned)
 Copied from CRM [id number]. Topic: Clinical - Medication Refill >> Jul 28, 2024  2:04 PM Drema MATSU wrote: Medication: WEGOVY  2.4 MG/0.75ML SOAJ SQ injection, rosuvastatin  (CRESTOR ) 5 MG tablet, losartan  (COZAAR ) 25 MG tablet, FLUoxetine  (PROZAC ) 40 MG capsule,buPROPion  (WELLBUTRIN  XL) 150 MG 24 hr tablet  Has the patient contacted their pharmacy? Yes (Agent: If no, request that the patient contact the pharmacy for the refill. If patient does not wish to contact the pharmacy document the reason why and proceed with request.) sent request for refill  (Agent: If yes, when and what did the pharmacy advise?)  This is the patient's preferred pharmacy:  [id number] [place], [place] - [address] AT [facility] [address] [place] [address] Phone: [phone] Fax: [phone]  Is this the correct pharmacy for this prescription? Yes If no, delete pharmacy and type the correct one.   Has the prescription been filled recently? Yes  Is the patient out of the medication? Yes  Has the patient been seen for an appointment in the last year OR does the patient have an upcoming appointment? Yes  Can we respond through MyChart? Yes  Agent: Please be advised that Rx refills may take up to 3 business days. We ask that you follow-up with your pharmacy.

## 2024-07-29 NOTE — Telephone Encounter (Signed)
 Please call patient:  Looks like she is overdue for follow up and has cancelled several appointments in January 2026. We will need to see her for further refills. Can we get her in sooner?

## 2024-07-31 ENCOUNTER — Ambulatory Visit: Admitting: Primary Care

## 2024-08-03 ENCOUNTER — Other Ambulatory Visit: Payer: Self-pay | Admitting: Primary Care

## 2024-08-03 DIAGNOSIS — F411 Generalized anxiety disorder: Secondary | ICD-10-CM

## 2024-08-03 MED ORDER — FLUOXETINE HCL 40 MG PO CAPS: 40.0000 mg | ORAL_CAPSULE | Freq: Every day | ORAL | 0 refills | Status: DC

## 2024-08-07 ENCOUNTER — Ambulatory Visit: Admitting: Primary Care

## 2024-08-07 MED ORDER — LOSARTAN POTASSIUM 25 MG PO TABS: 25.0000 mg | ORAL_TABLET | Freq: Every day | ORAL | 0 refills | Status: DC

## 2024-08-07 MED ORDER — BUPROPION HCL ER (XL) 150 MG PO TB24: 150.0000 mg | ORAL_TABLET | Freq: Every day | ORAL | 0 refills | Status: DC

## 2024-08-07 MED ORDER — ROSUVASTATIN CALCIUM 5 MG PO TABS: 5.0000 mg | ORAL_TABLET | Freq: Every day | ORAL | 0 refills | Status: DC

## 2024-08-14 ENCOUNTER — Ambulatory Visit: Admitting: Primary Care

## 2024-08-20 ENCOUNTER — Encounter: Payer: Self-pay | Admitting: Primary Care

## 2024-08-20 ENCOUNTER — Ambulatory Visit: Payer: Self-pay | Admitting: Primary Care

## 2024-08-20 VITALS — BP 120/76 | HR 87 | Temp 98.3°F | Ht 66.0 in | Wt 207.0 lb

## 2024-08-20 DIAGNOSIS — E66811 Obesity, class 1: Secondary | ICD-10-CM | POA: Diagnosis not present

## 2024-08-20 DIAGNOSIS — I10 Essential (primary) hypertension: Secondary | ICD-10-CM

## 2024-08-20 DIAGNOSIS — Z8673 Personal history of transient ischemic attack (TIA), and cerebral infarction without residual deficits: Secondary | ICD-10-CM | POA: Diagnosis not present

## 2024-08-20 DIAGNOSIS — Z6833 Body mass index (BMI) 33.0-33.9, adult: Secondary | ICD-10-CM | POA: Diagnosis not present

## 2024-08-20 DIAGNOSIS — Z1231 Encounter for screening mammogram for malignant neoplasm of breast: Secondary | ICD-10-CM | POA: Diagnosis not present

## 2024-08-20 DIAGNOSIS — Z23 Encounter for immunization: Secondary | ICD-10-CM

## 2024-08-20 DIAGNOSIS — F411 Generalized anxiety disorder: Secondary | ICD-10-CM | POA: Diagnosis not present

## 2024-08-20 DIAGNOSIS — E6609 Other obesity due to excess calories: Secondary | ICD-10-CM | POA: Diagnosis not present

## 2024-08-20 DIAGNOSIS — R7303 Prediabetes: Secondary | ICD-10-CM

## 2024-08-20 MED ORDER — LOSARTAN POTASSIUM 25 MG PO TABS: 25.0000 mg | ORAL_TABLET | Freq: Every day | ORAL | 3 refills | Status: AC

## 2024-08-20 MED ORDER — WEGOVY 2.4 MG/0.75ML ~~LOC~~ SOAJ: 2.4000 mg | SUBCUTANEOUS | 1 refills | Status: DC

## 2024-08-20 MED ORDER — FLUOXETINE HCL 40 MG PO CAPS: 40.0000 mg | ORAL_CAPSULE | Freq: Every day | ORAL | 3 refills | Status: AC

## 2024-08-20 MED ORDER — ROSUVASTATIN CALCIUM 5 MG PO TABS: 5.0000 mg | ORAL_TABLET | Freq: Every day | ORAL | 3 refills | Status: AC

## 2024-08-20 MED ORDER — BUPROPION HCL ER (XL) 150 MG PO TB24: 150.0000 mg | ORAL_TABLET | Freq: Every day | ORAL | 3 refills | Status: AC

## 2024-08-20 NOTE — Assessment & Plan Note (Signed)
 Repeat A1c pending.  Commended her on healthy diet and exercise

## 2024-08-20 NOTE — Progress Notes (Signed)
 "  Subjective:    Patient ID: Jessica Hampton, female    DOB: January 17, 1971, 54 y.o.   MRN: [id number]  Jessica Hampton is a very pleasant 54 y.o. female who presents today for follow up of chronic conditions.  She declines CPE today.  She would like a Shingrix  vaccine  1) GAD: Currently managed on bupropion  XL 150 mg daily, fluoxetine  40 mg daily.  Overall she feels managed. Denies concerns today. Denies nausea, GI upset, SI/HI.  2) Class 1 Obesity: Currently managed on Wegovy  2.4 mg weekly. She feels well managed on her regimen. She is working on a healthy diet with increased intake of protein. She is reducing carbs. She is walking 3 times weekly.  She has been out of the Wegovy  2.4 mg for 3 weeks.  She denies GI upset, nausea, constipation  Body mass index is 33.41 kg/m.  Wt Readings from Last 3 Encounters:  08/20/24 207 lb (93.9 kg)  05/24/23 204 lb 12.8 oz (92.9 kg)  10/19/22 211 lb (95.7 kg)    3) Hypertension/Hyperlipidemia: Currently managed on losartan  25 mg daily and rosuvastatin  5 mg daily. She denies chest pain, dizziness, headaches.  She is due for repeat lipid panel and CMP today   BP Readings from Last 3 Encounters:  08/20/24 120/76  05/24/23 (!) 140/82  10/19/22 126/82       Review of Systems  Eyes:  Negative for visual disturbance.  Respiratory:  Negative for shortness of breath.   Cardiovascular:  Negative for chest pain.  Gastrointestinal:  Negative for constipation and diarrhea.  Neurological:  Negative for headaches.  Psychiatric/Behavioral:  The patient is not nervous/anxious.          Past Medical History:  Diagnosis Date   Abnormal uterine bleeding 04/29/2020   Acute CVA (cerebrovascular accident) (HCC) 10/04/2019   Anemia    Anxiety    Complication of anesthesia    Family history of adverse reaction to anesthesia    MOM NAUSEATED   Fibroids, intramural 07/14/2020   GERD (gastroesophageal reflux disease)    OCC   Hypertension     Hypertensive urgency 10/04/2019   PONV (postoperative nausea and vomiting)    NAUSEATED    Social History   Socioeconomic History   Marital status: Married    Spouse name: Not on file   Number of children: Not on file   Years of education: Not on file   Highest education level: Associate degree: occupational, scientist, product/process development, or vocational program  Occupational History   Occupation: Theme Park Manager  Tobacco Use   Smoking status: Never   Smokeless tobacco: Never  Vaping Use   Vaping status: Never Used  Substance and Sexual Activity   Alcohol use: No   Drug use: No   Sexual activity: Not on file  Other Topics Concern   Not on file  Social History Narrative   Lives with husband   Social Drivers of Health   Tobacco Use: Low Risk (08/20/2024)   Patient History    Smoking Tobacco Use: Never    Smokeless Tobacco Use: Never    Passive Exposure: Not on file  Financial Resource Strain: Low Risk (08/16/2024)   Overall Financial Resource Strain (CARDIA)    Difficulty of Paying Living Expenses: Not hard at all  Food Insecurity: No Food Insecurity (08/16/2024)   Epic    Worried About Radiation Protection Practitioner of Food in the Last Year: Never true    Ran Out of Food in the Last Year: Never true  Transportation Needs: No Transportation Needs (08/16/2024)   Epic    Lack of Transportation (Medical): No    Lack of Transportation (Non-Medical): No  Physical Activity: Insufficiently Active (08/16/2024)   Exercise Vital Sign    Days of Exercise per Week: 3 days    Minutes of Exercise per Session: 30 min  Stress: Stress Concern Present (08/16/2024)   [employer] of Occupational Health - Occupational Stress Questionnaire    Feeling of Stress: Rather much  Social Connections: Socially Integrated (08/16/2024)   Social Connection and Isolation Panel    Frequency of Communication with Friends and Family: More than three times a week    Frequency of Social Gatherings with Friends and Family: Once a week     Attends Religious Services: 1 to 4 times per year    Active Member of Clubs or Organizations: Yes    Attends Banker Meetings: More than 4 times per year    Marital Status: Married  Catering Manager Violence: Not on file  Depression (PHQ2-9): Medium Risk (05/24/2023)   Depression (PHQ2-9)    PHQ-2 Score: 5  Alcohol Screen: Low Risk (10/18/2022)   Alcohol Screen    Last Alcohol Screening Score (AUDIT): 1  Housing: Low Risk (08/16/2024)   Epic    Unable to Pay for Housing in the Last Year: No    Number of Times Moved in the Last Year: 0    Homeless in the Last Year: No  Utilities: Not on file  Health Literacy: Not on file    Past Surgical History:  Procedure Laterality Date   ABDOMINAL HYSTERECTOMY N/A 07/14/2020   Procedure: TOTAL HYSTERECTOMY ABDOMINAL;  Surgeon: Verdon Keen, MD;  Location: [facility];  Service: Gynecology;  Laterality: N/A;   APPLICATION OF WOUND VAC  07/14/2020   Procedure: APPLICATION OF WOUND VAC;  Surgeon: Verdon Keen, MD;  Location: [facility];  Service: Gynecology;;  serial# [id number]   COLONOSCOPY WITH PROPOFOL  N/A 06/29/2022   Procedure: COLONOSCOPY WITH PROPOFOL ;  Surgeon: Unk Corinn Skiff, MD;  Location: [facility];  Service: Gastroenterology;  Laterality: N/A;   CYSTOSCOPY N/A 07/14/2020   Procedure: CYSTOSCOPY;  Surgeon: Verdon Keen, MD;  Location: [facility];  Service: Gynecology;  Laterality: N/A;   LAPAROSCOPIC GASTRIC SLEEVE RESECTION WITH HIATAL HERNIA REPAIR N/A 10/02/2016   Procedure: LAPAROSCOPIC GASTRIC SLEEVE RESECTION WITH HIATAL HERNIA REPAIR;  Surgeon: Thom CHRISTELLA Pin, MD;  Location: [facility];  Service: General;  Laterality: N/A;   LOOP RECORDER INSERTION N/A 11/19/2019   Procedure: LOOP RECORDER INSERTION;  Surgeon: Ammon Blunt, MD;  Location: [facility];  Service: Cardiovascular;  Laterality: N/A;   TEE WITHOUT CARDIOVERSION N/A 11/24/2019   Procedure: TRANSESOPHAGEAL ECHOCARDIOGRAM (TEE);  Surgeon:  Bosie Vinie LABOR, MD;  Location: [facility];  Service: Cardiovascular;  Laterality: N/A;   TOTAL LAPAROSCOPIC HYSTERECTOMY WITH SALPINGECTOMY Bilateral 07/14/2020   Procedure: TOTAL LAPAROSCOPIC HYSTERECTOMY WITH BILATERAL SALPINGECTOMY;  Surgeon: Verdon Keen, MD;  Location: [facility];  Service: Gynecology;  Laterality: Bilateral;   TUBAL LIGATION  2004    Family History  Problem Relation Age of Onset   Hypertension Mother    Hyperlipidemia Father    Hypertension Father    Breast cancer Neg Hx     Allergies[1]  Medications Ordered Prior to Encounter[2]  BP 120/76   Pulse 87   Temp 98.3 F (36.8 C) (Oral)   Ht 5' 6 (1.676 m)   Wt 207 lb (93.9 kg)   LMP 07/04/2020   SpO2 95%  BMI 33.41 kg/m  Objective:   Physical Exam Cardiovascular:     Rate and Rhythm: Normal rate and regular rhythm.  Pulmonary:     Effort: Pulmonary effort is normal.     Breath sounds: Normal breath sounds.  Abdominal:     General: Bowel sounds are normal.     Palpations: Abdomen is soft.     Tenderness: There is no abdominal tenderness.  Musculoskeletal:     Cervical back: Neck supple.  Skin:    General: Skin is warm and dry.  Neurological:     Mental Status: She is alert and oriented to person, place, and time.  Psychiatric:        Mood and Affect: Mood normal.     Physical Exam        Assessment & Plan:  HYPERTENSION, BENIGN ESSENTIAL Assessment & Plan: Controlled.  Continue losartan  25 mg daily. CMP pending  Orders: -     Lipid panel -     Hemoglobin A1c -     Comprehensive metabolic panel with GFR -     Losartan  Potassium; Take 1 tablet (25 mg total) by mouth daily. for blood pressure.  Dispense: 90 tablet; Refill: 3  Class 1 obesity due to excess calories with serious comorbidity and body mass index (BMI) of 33.0 to 33.9 in adult Assessment & Plan: Stable.  Resume Wegovy  at 2.4 mg weekly. Repeat labs pending  She will update.   Orders: -     Wegovy ; Inject 2.4  mg into the skin once a week.  Dispense: 9 mL; Refill: 1  Screening mammogram for breast cancer -     3D Screening Mammogram, Left and Right; Future  GAD (generalized anxiety disorder) Assessment & Plan: Controlled.  Continue fluoxetine  40 mg daily, bupropion  XL 150 mg daily. Refills provided.  Orders: -     buPROPion  HCl ER (XL); Take 1 tablet (150 mg total) by mouth daily. For anxiety  Dispense: 90 tablet; Refill: 3 -     FLUoxetine  HCl; Take 1 capsule (40 mg total) by mouth daily. for anxiety  Dispense: 90 capsule; Refill: 3  History of CVA (cerebrovascular accident) -     Lipid panel -     Hemoglobin A1c -     Comprehensive metabolic panel with GFR -     Rosuvastatin  Calcium ; Take 1 tablet (5 mg total) by mouth daily. for cholesterol.  Dispense: 90 tablet; Refill: 3  Prediabetes Assessment & Plan: Repeat A1c pending.  Commended her on healthy diet and exercise     Assessment and Plan Assessment & Plan         Comer MARLA Gaskins, NP       [1]  Allergies Allergen Reactions   Lisinopril Shortness Of Breath and Cough    No airway swelling.   Nsaids Other (See Comments)    History of gastric bypass   [2]  No current outpatient medications on file prior to visit.   No current facility-administered medications on file prior to visit.   "

## 2024-08-20 NOTE — Assessment & Plan Note (Signed)
 Stable.  Resume Wegovy  at 2.4 mg weekly. Repeat labs pending  She will update.

## 2024-08-20 NOTE — Assessment & Plan Note (Signed)
Controlled. ? ?Continue losartan 25 mg daily. ? ?CMP pending. ?

## 2024-08-20 NOTE — Addendum Note (Signed)
 Addended by: JACKOLYN PLANAS on: 08/20/2024 04:09 PM   Modules accepted: Orders

## 2024-08-20 NOTE — Assessment & Plan Note (Signed)
 Controlled.  Continue fluoxetine  40 mg daily, bupropion  XL 150 mg daily. Refills provided.

## 2024-08-20 NOTE — Patient Instructions (Addendum)
 Stop by the lab prior to leaving today. I will notify you of your results once received.   [facility].   Schedule a nurse visit for 2 to 6 months for the second shingles vaccine  It was a pleasure to see you today!

## 2024-08-21 ENCOUNTER — Ambulatory Visit: Payer: Self-pay | Admitting: Primary Care

## 2024-08-21 LAB — COMPREHENSIVE METABOLIC PANEL WITH GFR
ALT: 28 U/L (ref 3–35)
AST: 26 U/L (ref 5–37)
Albumin: 4.5 g/dL (ref 3.5–5.2)
Alkaline Phosphatase: 67 U/L (ref 39–117)
BUN: 16 mg/dL (ref 6–23)
CO2: 32 meq/L (ref 19–32)
Calcium: 9.3 mg/dL (ref 8.4–10.5)
Chloride: 102 meq/L (ref 96–112)
Creatinine, Ser: 0.65 mg/dL (ref 0.40–1.20)
GFR: 101.3 mL/min
Glucose, Bld: 77 mg/dL (ref 70–99)
Potassium: 3.9 meq/L (ref 3.5–5.1)
Sodium: 139 meq/L (ref 135–145)
Total Bilirubin: 0.5 mg/dL (ref 0.2–1.2)
Total Protein: 6.6 g/dL (ref 6.0–8.3)

## 2024-08-21 LAB — LIPID PANEL
Cholesterol: 145 mg/dL (ref 28–200)
HDL: 72.7 mg/dL
LDL Cholesterol: 56 mg/dL (ref 10–99)
NonHDL: 72.15
Total CHOL/HDL Ratio: 2
Triglycerides: 83 mg/dL (ref 10.0–149.0)
VLDL: 16.6 mg/dL (ref 0.0–40.0)

## 2024-08-21 LAB — HEMOGLOBIN A1C: Hgb A1c MFr Bld: 5.4 % (ref 4.6–6.5)

## 2024-08-21 NOTE — Telephone Encounter (Signed)
 Pls submit PA for Wegovy  2.4 mg (see other message[s] by clicking blue '[employer]' link).

## 2024-08-27 DIAGNOSIS — R7303 Prediabetes: Secondary | ICD-10-CM

## 2024-08-27 DIAGNOSIS — I10 Essential (primary) hypertension: Secondary | ICD-10-CM

## 2024-08-27 DIAGNOSIS — Z8673 Personal history of transient ischemic attack (TIA), and cerebral infarction without residual deficits: Secondary | ICD-10-CM

## 2024-08-27 DIAGNOSIS — E6609 Other obesity due to excess calories: Secondary | ICD-10-CM

## 2024-08-28 ENCOUNTER — Ambulatory Visit: Admitting: Primary Care

## 2024-08-28 MED ORDER — WEGOVY 1.5 MG PO TABS: 1.5000 mg | ORAL_TABLET | Freq: Every day | ORAL | 0 refills | Status: AC

## 2024-09-01 ENCOUNTER — Other Ambulatory Visit (HOSPITAL_COMMUNITY): Payer: Self-pay

## 2024-09-01 ENCOUNTER — Telehealth: Payer: Self-pay | Admitting: Pharmacy Technician

## 2024-09-01 NOTE — Telephone Encounter (Signed)
 Pharmacy Patient Advocate Encounter   Received notification from [facility] KEY that prior authorization for Wegovy  1.5 mg tablets is required/requested.   Insurance verification completed.   The patient is insured through [employer].   Per test claim: Per test claim, medication is not covered due to plan/benefit exclusion, PA not submitted at this time

## 2024-09-02 NOTE — Telephone Encounter (Signed)
 Patient notified via MyChart on 08/27/2024 that she may use coupon cards in case the insurance would not cover.

## 2024-09-02 NOTE — Telephone Encounter (Signed)
 Fyi to [employer]

## 2024-09-10 ENCOUNTER — Encounter
